# Patient Record
Sex: Female | Born: 1955 | Race: White | Hispanic: No | Marital: Married | Smoking: Never smoker
Health system: Southern US, Community
[De-identification: ages and names within clinical notes are randomized; demographics above are authoritative.]

## PROBLEM LIST (undated history)

## (undated) DIAGNOSIS — J189 Pneumonia, unspecified organism: Secondary | ICD-10-CM

## (undated) DIAGNOSIS — S92902A Unspecified fracture of left foot, initial encounter for closed fracture: Secondary | ICD-10-CM

## (undated) DIAGNOSIS — E785 Hyperlipidemia, unspecified: Secondary | ICD-10-CM

## (undated) DIAGNOSIS — S329XXA Fracture of unspecified parts of lumbosacral spine and pelvis, initial encounter for closed fracture: Secondary | ICD-10-CM

## (undated) DIAGNOSIS — I447 Left bundle-branch block, unspecified: Secondary | ICD-10-CM

## (undated) DIAGNOSIS — I10 Essential (primary) hypertension: Secondary | ICD-10-CM

## (undated) DIAGNOSIS — R011 Cardiac murmur, unspecified: Secondary | ICD-10-CM

## (undated) HISTORY — DX: Fracture of unspecified parts of lumbosacral spine and pelvis, initial encounter for closed fracture: S32.9XXA

## (undated) HISTORY — PX: ROTATOR CUFF REPAIR: SHX139

## (undated) HISTORY — DX: Essential (primary) hypertension: I10

## (undated) HISTORY — DX: Cardiac murmur, unspecified: R01.1

## (undated) HISTORY — DX: Left bundle-branch block, unspecified: I44.7

## (undated) HISTORY — PX: PIP JOINT FUSION: SHX2238

## (undated) HISTORY — DX: Unspecified fracture of left foot, initial encounter for closed fracture: S92.902A

## (undated) HISTORY — DX: Hyperlipidemia, unspecified: E78.5

---

## 2000-01-13 ENCOUNTER — Ambulatory Visit (HOSPITAL_COMMUNITY): Admission: RE | Admit: 2000-01-13 | Discharge: 2000-01-13 | Payer: Self-pay | Admitting: Gastroenterology

## 2005-06-14 ENCOUNTER — Ambulatory Visit: Payer: Self-pay | Admitting: Internal Medicine

## 2005-07-02 ENCOUNTER — Ambulatory Visit: Payer: Self-pay | Admitting: Internal Medicine

## 2005-07-02 HISTORY — PX: COLONOSCOPY: SHX174

## 2006-08-02 LAB — HM COLONOSCOPY

## 2011-12-21 ENCOUNTER — Other Ambulatory Visit (HOSPITAL_BASED_OUTPATIENT_CLINIC_OR_DEPARTMENT_OTHER): Payer: Self-pay | Admitting: Family Medicine

## 2011-12-21 ENCOUNTER — Ambulatory Visit (HOSPITAL_BASED_OUTPATIENT_CLINIC_OR_DEPARTMENT_OTHER)
Admission: RE | Admit: 2011-12-21 | Discharge: 2011-12-21 | Disposition: A | Discharge status: Home / Self Care | Payer: BC Managed Care – PPO | Source: Ambulatory Visit | Attending: Family Medicine | Admitting: Family Medicine

## 2011-12-21 DIAGNOSIS — R05 Cough: Secondary | ICD-10-CM

## 2011-12-21 DIAGNOSIS — R059 Cough, unspecified: Secondary | ICD-10-CM | POA: Insufficient documentation

## 2012-03-16 LAB — HM MAMMOGRAPHY

## 2012-07-28 ENCOUNTER — Encounter: Payer: Self-pay | Admitting: Internal Medicine

## 2012-09-07 LAB — HM PAP SMEAR

## 2012-11-09 ENCOUNTER — Ambulatory Visit (INDEPENDENT_AMBULATORY_CARE_PROVIDER_SITE_OTHER): Payer: BC Managed Care – PPO | Admitting: Family Medicine

## 2012-11-09 ENCOUNTER — Encounter: Payer: Self-pay | Admitting: Family Medicine

## 2012-11-09 VITALS — BP 162/89 | HR 89 | Temp 97.5°F | Ht 64.0 in | Wt 151.0 lb

## 2012-11-09 DIAGNOSIS — R109 Unspecified abdominal pain: Secondary | ICD-10-CM

## 2012-11-09 DIAGNOSIS — R112 Nausea with vomiting, unspecified: Secondary | ICD-10-CM

## 2012-11-09 LAB — POCT URINALYSIS DIPSTICK
Bilirubin, UA: NEGATIVE
Blood, UA: NEGATIVE
Clarity, UA: NEGATIVE
Color, UA: NEGATIVE
Glucose, UA: NEGATIVE
Ketones, UA: POSITIVE
Leukocytes, UA: NEGATIVE
Nitrite, UA: NEGATIVE
Protein, UA: NEGATIVE
Spec Grav, UA: 1.01
Urobilinogen, UA: NEGATIVE
pH, UA: 6

## 2012-11-09 MED ORDER — ONDANSETRON HCL 4 MG PO TABS: 4.0000 mg | ORAL_TABLET | Freq: Three times a day (TID) | ORAL | Status: DC | PRN

## 2012-11-09 MED ORDER — DIPHENOXYLATE-ATROPINE 2.5-0.025 MG PO TABS: 1.0000 | ORAL_TABLET | Freq: Four times a day (QID) | ORAL | Status: DC | PRN

## 2012-11-09 MED ORDER — MECLIZINE HCL 25 MG PO TABS: 25.0000 mg | ORAL_TABLET | Freq: Three times a day (TID) | ORAL | Status: DC | PRN

## 2012-11-09 MED ORDER — PROMETHAZINE HCL 25 MG/ML IJ SOLN
25.0000 mg | Freq: Once | INTRAMUSCULAR | Status: AC
  Administered 2012-11-09: 25 mg via INTRAMUSCULAR

## 2012-11-09 NOTE — Patient Instructions (Addendum)
1)  Nausea - Zofran (1-2 up to 3 times per day)   2)  Diarrhea - Lomotil (Take as directed on bottle)   3)  Fluid - G2 or water or Crystal Light   4)  BRAT Diet / Chicken & Rice Soup  5)  Probiotic pills (Align), Activia, Lemon Ginger Tea   6)  If you continue to have dizziness you can take the Antivert.     Diet for Diarrhea, Adult Having frequent, runny stools (diarrhea) has many causes. Diarrhea may be caused or worsened by food or drink. Diarrhea may be relieved by changing your diet. IF YOU ARE NOT TOLERATING SOLID FOODS:  Drink enough water and fluids to keep your urine clear or pale yellow.  Avoid sugary drinks and sodas as well as milk-based beverages.  Avoid beverages containing caffeine and alcohol.  You may try rehydrating beverages. You can make your own by following this recipe:   tsp table salt.   tsp baking soda.   tsp salt substitute (potassium chloride).  1 tbs + 1 tsp sugar.  1 qt water. As your stools become more solid, you can start eating solid foods. Add foods one at a time. If a certain food causes your diarrhea to get worse, avoid that food and try other foods. A low fiber, low-fat, and lactose-free diet is recommended. Small, frequent meals may be better tolerated.  Starches  Allowed:  White, [place], and pita breads, plain rolls, buns, bagels. Plain muffins, matzo. Soda, saltine, or graham crackers. Pretzels, melba toast, zwieback. Cooked cereals made with water: cornmeal, farina, cream cereals. Dry cereals: refined corn, wheat, rice. Potatoes prepared any way without skins, refined macaroni, spaghetti, noodles, refined rice.  Avoid:  Bread, rolls, or crackers made with whole wheat, multi-grains, rye, bran seeds, nuts, or coconut. Corn tortillas or taco shells. Cereals containing whole grains, multi-grains, bran, coconut, nuts, or raisins. Cooked or dry oatmeal. Coarse wheat cereals, granola. Cereals advertised as "high-fiber." Potato skins. Whole  grain pasta, wild or brown rice. Popcorn. Sweet potatoes/yams. Sweet rolls, doughnuts, waffles, pancakes, sweet breads. Vegetables  Allowed: Strained tomato and vegetable juices. Most well-cooked and canned vegetables without seeds. Fresh: Tender lettuce, cucumber without the skin, cabbage, spinach, bean sprouts.  Avoid: Fresh, cooked, or canned: Artichokes, baked beans, beet greens, broccoli, Brussels sprouts, corn, kale, legumes, peas, sweet potatoes. Cooked: Green or red cabbage, spinach. Avoid large servings of any vegetables, because vegetables shrink when cooked, and they contain more fiber per serving than fresh vegetables. Fruit  Allowed: All fruit juices except prune juice. Cooked or canned: Apricots, applesauce, cantaloupe, cherries, fruit cocktail, grapefruit, grapes, kiwi, mandarin oranges, peaches, pears, plums, watermelon. Fresh: Apples without skin, ripe banana, grapes, cantaloupe, cherries, grapefruit, peaches, oranges, plums. Keep servings limited to  cup or 1 piece.  Avoid: Fresh: Apple with skin, apricots, mango, pears, raspberries, strawberries. Prune juice, stewed or dried prunes. Dried fruits, raisins, dates. Large servings of all fresh fruits. Meat and Meat Substitutes  Allowed: Ground or well-cooked tender beef, ham, veal, lamb, pork, or poultry. Eggs, plain cheese. Fish, oysters, shrimp, lobster, other seafoods. Liver, organ meats.  Avoid: Tough, fibrous meats with gristle. Peanut butter, smooth or chunky. Cheese, nuts, seeds, legumes, dried peas, beans, lentils. Milk  Allowed: Yogurt, lactose-free milk, kefir, drinkable yogurt, buttermilk, soy milk.  Avoid: Milk, chocolate milk, beverages made with milk, such as milk shakes. Soups  Allowed: Bouillon, broth, or soups made from allowed foods. Any strained soup.  Avoid: Soups made from vegetables  that are not allowed, cream or milk-based soups. Desserts and Sweets  Allowed: Sugar-free gelatin, sugar-free frozen ice  pops made without sugar alcohol.  Avoid: Plain cakes and cookies, pie made with allowed fruit, pudding, custard, cream pie. Gelatin, fruit, ice, sherbet, frozen ice pops. Ice cream, ice milk without nuts. Plain hard candy, honey, jelly, molasses, syrup, sugar, chocolate syrup, gumdrops, marshmallows. Fats and Oils  Allowed: Avoid any fats and oils.  Avoid: Seeds, nuts, olives, avocados. Margarine, butter, cream, mayonnaise, salad oils, plain salad dressings made from allowed foods. Plain gravy, crisp bacon without rind. Beverages  Allowed: Water, decaffeinated teas, oral rehydration solutions, sugar-free beverages.  Avoid: Fruit juices, caffeinated beverages (coffee, tea, soda or pop), alcohol, sports drinks, or lemon-lime soda or pop. Condiments  Allowed: Ketchup, mustard, horseradish, vinegar, cream sauce, cheese sauce, cocoa powder. Spices in moderation: allspice, basil, bay leaves, celery powder or leaves, cinnamon, cumin powder, curry powder, ginger, mace, marjoram, onion or garlic powder, oregano, paprika, parsley flakes, ground pepper, rosemary, sage, savory, tarragon, thyme, turmeric.  Avoid: Coconut, honey. Weight Monitoring: Weigh yourself every day. You should weigh yourself in the morning after you urinate and before you eat breakfast. Wear the same amount of clothing when you weigh yourself. Record your weight daily. Bring your recorded weights to your clinic visits. Tell your caregiver right away if you have gained 3 lb/1.4 kg or more in 1 day, 5 lb/2.3 kg in a week, or whatever amount you were told to report. SEEK IMMEDIATE MEDICAL CARE IF:   You are unable to keep fluids down.  You start to throw up (vomit) or diarrhea keeps coming back (persistent).  Abdominal pain develops, increases, or can be felt in one place (localizes).  You have an oral temperature above 102 F (38.9 C), not controlled by medicine.  Diarrhea contains blood or mucus.  You develop excessive  weakness, dizziness, fainting, or extreme thirst. MAKE SURE YOU:   Understand these instructions.  Will watch your condition.  Will get help right away if you are not doing well or get worse. Document Released: 10/09/2003 Document Revised: 10/11/2011 Document Reviewed: 12/03/2011 [facility] Patient Information 2013 [place], [place]. Norovirus Infection Norovirus illness is caused by a viral infection. The term norovirus refers to a group of viruses. Any of those viruses can cause norovirus illness. This illness is often referred to by other names such as viral gastroenteritis, stomach flu, and food poisoning. Anyone can get a norovirus infection. People can have the illness multiple times during their lifetime. CAUSES  Norovirus is found in the stool or vomit of infected people. It is easily spread from person to person (contagious). People with norovirus are contagious from the moment they begin feeling ill. They may remain contagious for as long as 3 days to 2 weeks after recovery. People can become infected with the virus in several ways. This includes:  Eating food or drinking liquids that are contaminated with norovirus.  Touching surfaces or objects contaminated with norovirus, and then placing your hand in your mouth.  Having direct contact with a person who is infected and shows symptoms. This may occur while caring for someone with illness or while sharing foods or eating utensils with someone who is ill. SYMPTOMS  Symptoms usually begin 1 to 2 days after ingestion of the virus. Symptoms may include:  Nausea.  Vomiting.  Diarrhea.  Stomach cramps.  Low-grade fever.  Chills.  Headache.  Muscle aches.  Tiredness. Most people with norovirus illness get better within 1  to 2 days. Some people become dehydrated because they cannot drink enough liquids to replace those lost from vomiting and diarrhea. This is especially true for young children, the elderly, and others who are  unable to care for themselves. DIAGNOSIS  Diagnosis is based on your symptoms and exam. Currently, only state public health laboratories have the ability to test for norovirus in stool or vomit. TREATMENT  No specific treatment exists for norovirus infections. No vaccine is available to prevent infections. Norovirus illness is usually brief in healthy people. If you are ill with vomiting and diarrhea, you should drink enough water and fluids to keep your urine clear or pale yellow. Dehydration is the most serious health effect that can result from this infection. By drinking oral rehydration solution (ORS), people can reduce their chance of becoming dehydrated. There are many commercially available pre-made and powdered ORS designed to safely rehydrate people. These may be recommended by your caregiver. Replace any new fluid losses from diarrhea or vomiting with ORS as follows:  If your child weighs 10 kg or less (22 lb or less), give 60 to 120 ml ( to  cup or 2 to 4 oz) of ORS for each diarrheal stool or vomiting episode.  If your child weighs more than 10 kg (more than 22 lb), give 120 to 240 ml ( to 1 cup or 4 to 8 oz) of ORS for each diarrheal stool or vomiting episode. HOME CARE INSTRUCTIONS   Follow all your caregiver's instructions.  Avoid sugar-free and alcoholic drinks while ill.  Only take over-the-counter or prescription medicines for pain, vomiting, diarrhea, or fever as directed by your caregiver. You can decrease your chances of coming in contact with norovirus or spreading it by following these steps:  Frequently wash your hands, especially after using the toilet, changing diapers, and before eating or preparing food.  Carefully wash fruits and vegetables. Cook shellfish before eating them.  Do not prepare food for others while you are infected and for at least 3 days after recovering from illness.  Thoroughly clean and disinfect contaminated surfaces immediately after an  episode of illness using a bleach-based household cleaner.  Immediately remove and wash clothing or linens that may be contaminated with the virus.  Use the toilet to dispose of any vomit or stool. Make sure the surrounding area is kept clean.  Food that may have been contaminated by an ill person should be discarded. SEEK IMMEDIATE MEDICAL CARE IF:   You develop symptoms of dehydration that do not improve with fluid replacement. This may include:  Excessive sleepiness.  Lack of tears.  Dry mouth.  Dizziness when standing.  Weak pulse. Document Released: 10/09/2002 Document Revised: 10/11/2011 Document Reviewed: 11/10/2009 [facility] Patient Information 2013 [place], [place].

## 2012-11-09 NOTE — Progress Notes (Signed)
Subjective:     Patient ID: Brenda Ayala, female   DOB: [birth year], 57 y.o.   MRN: [id number]  HPI Brenda Ayala  is here today with her husband Brenda Ayala with what appears to be a viral gastroenteritis.  She awoke this morning feeling dizzy then she began having vomiting and diarrhea. She feels that her symptoms are severe in nature and she is worsening.  She has not taken any medications for these symptoms.   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  She also mentions that she had a glycolic acid procedure done at her dermatologist's office yesterday but she doesn't think these are related.    Review of Systems  Gastrointestinal: Positive for nausea and diarrhea.  Neurological: Positive for dizziness.       Objective:   Physical Exam  Vitals reviewed. Constitutional:  Appears ill with the smell of ketones  Cardiovascular: Normal rate and regular rhythm.   Pulmonary/Chest: Effort normal and breath sounds normal.  Abdominal: Soft. She exhibits no distension and no mass. There is tenderness. There is no rebound and no guarding.       Assessment:     Viral Gastroenteritis (Vomiting/Diarrhea)     Plan:    Checked a U/A since she had such a strong smell of ketones.  Ketones were high but her sugar was normal and her urine was not infected.  She was given a shot of Phenergan in the office.  She was also given prescriptions for Zofran and Lomotil for her GI symptoms and Antivert for her dizziness.

## 2012-11-11 ENCOUNTER — Encounter: Payer: Self-pay | Admitting: Family Medicine

## 2012-11-11 DIAGNOSIS — R109 Unspecified abdominal pain: Secondary | ICD-10-CM | POA: Insufficient documentation

## 2012-11-11 DIAGNOSIS — R112 Nausea with vomiting, unspecified: Secondary | ICD-10-CM | POA: Insufficient documentation

## 2012-11-14 ENCOUNTER — Ambulatory Visit (INDEPENDENT_AMBULATORY_CARE_PROVIDER_SITE_OTHER): Payer: BC Managed Care – PPO | Admitting: Family Medicine

## 2012-11-14 VITALS — BP 109/64 | HR 59 | Wt 150.0 lb

## 2012-11-14 DIAGNOSIS — R824 Acetonuria: Secondary | ICD-10-CM

## 2012-11-14 DIAGNOSIS — R42 Dizziness and giddiness: Secondary | ICD-10-CM

## 2012-11-14 LAB — POCT URINALYSIS DIPSTICK
Bilirubin, UA: NEGATIVE
Blood, UA: NEGATIVE
Glucose, UA: NEGATIVE
Ketones, UA: NEGATIVE
Leukocytes, UA: NEGATIVE
Nitrite, UA: NEGATIVE
Protein, UA: NEGATIVE
Spec Grav, UA: 1.01
Urobilinogen, UA: NEGATIVE
pH, UA: 5

## 2012-11-14 MED ORDER — SCOPOLAMINE 1 MG/3DAYS TD PT72: 1.0000 | MEDICATED_PATCH | TRANSDERMAL | Status: AC

## 2012-11-14 NOTE — Progress Notes (Signed)
Subjective:     Patient ID: Brenda Ayala, female   DOB: [birth year], 57 y.o.   MRN: [id number]  HPI Brenda Ayala is here today with her husband complaining of dizziness.  This started the day she developed GI symptoms last week. Her vomiting and diarrhea have resolved but she continues to be dizzy.  She has taken the Antivert she was given at her last visit.  It makes her very sleepy.    Review of Systems  Constitutional: Positive for fatigue.  Cardiovascular: Negative for chest pain.  Gastrointestinal: Positive for nausea. Negative for vomiting and diarrhea.  Neurological: Positive for dizziness.      Past Medical History  Diagnosis Date  . Heart murmur   . Fracture of left foot   . Fracture of left pelvis   . Hyperlipidemia   . 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  Her EF was mildly reduced to 50-55%  . Hypertension    Family History  Problem Relation Age of Onset  . Heart disease Mother    History   Social History  . Marital Status: Married    Spouse Name: N/A    Number of Children: N/A  . Years of Education: N/A   Occupational History  . Not on file.   Social History Main Topics  . Smoking status: Never Smoker   . Smokeless tobacco: Not on file  . Alcohol Use: No  . Drug Use: No  . Sexually Active: Yes    Birth Control/ Protection: Post-menopausal   Other Topics Concern  . 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  Appears tired   HENT:  Head: Normocephalic.  Eyes: Conjunctivae are normal. No scleral icterus.  Neck: Normal range of motion. Neck supple. No thyromegaly present.  Cardiovascular: Normal rate, regular rhythm and normal heart sounds.   Pulmonary/Chest: Effort normal and breath sounds normal.  Abdominal: Soft. Bowel sounds are normal. She exhibits no distension and no mass. There is no tenderness. There is no rebound and no guarding.  Lymphadenopathy:    She has no cervical adenopathy.  Neurological: She is alert.  Skin: No rash noted.  Psychiatric: She has a normal mood and affect.       Assessment:     Dizziness     Plan:     She was given a prescription for Scopalamine patches.   She was shown how to do the Hall-Pike maneuver.

## 2012-11-14 NOTE — Patient Instructions (Addendum)
1)  Dizziness - Try the patch vs Antivert; Hall-Pike Maneuvers     Dizziness Dizziness is a common problem. It is a feeling of unsteadiness or lightheadedness. You may feel like you are about to faint. Dizziness can lead to injury if you stumble or fall. A person of any age group can suffer from dizziness, but dizziness is more common in older adults. CAUSES  Dizziness can be caused by many different things, including:  Middle ear problems.  Standing for too long.  Infections.  An allergic reaction.  Aging.  An emotional response to something, such as the sight of blood.  Side effects of medicines.  Fatigue.  Problems with circulation or blood pressure.  Excess use of alcohol, medicines, or illegal drug use.  Breathing too fast (hyperventilation).  An arrhythmia or problems with your heart rhythm.  Low red blood cell count (anemia).  Pregnancy.  Vomiting, diarrhea, fever, or other illnesses that cause dehydration.  Diseases or conditions such as Parkinson's disease, high blood pressure (hypertension), diabetes, and thyroid problems.  Exposure to extreme heat. DIAGNOSIS  To find the cause of your dizziness, your caregiver may do a physical exam, lab tests, radiologic imaging scans, or an electrocardiography test (ECG).  TREATMENT  Treatment of dizziness depends on the cause of your symptoms and can vary greatly. HOME CARE INSTRUCTIONS   Drink enough fluids to keep your urine clear or pale yellow. This is especially important in very hot weather. In the elderly, it is also important in cold weather.  If your dizziness is caused by medicines, take them exactly as directed. When taking blood pressure medicines, it is especially important to get up slowly.  Rise slowly from chairs and steady yourself until you feel okay.  In the morning, first sit up on the side of the bed. When this seems okay, stand slowly while holding onto something until you know your balance is  fine.  If you need to stand in one place for a long time, be sure to move your legs often. Tighten and relax the muscles in your legs while standing.  If dizziness continues to be a problem, have someone stay with you for a day or two. Do this until you feel you are well enough to stay alone. Have the person call your caregiver if he or she notices changes in you that are concerning.  Do not drive or use heavy machinery if you feel dizzy.  Do not drink alcohol. SEEK IMMEDIATE MEDICAL CARE IF:   Your dizziness or lightheadedness gets worse.  You feel nauseous or vomit.  You develop problems with talking, walking, weakness, or using your arms, hands, or legs.  You are not thinking clearly or you have difficulty forming sentences. It may take a friend or family member to determine if your thinking is normal.  You develop chest pain, abdominal pain, shortness of breath, or sweating.  Your vision changes.  You notice any bleeding.  You have side effects from medicine that seems to be getting worse rather than better. MAKE SURE YOU:   Understand these instructions.  Will watch your condition.  Will get help right away if you are not doing well or get worse. Document Released: 01/12/2001 Document Revised: 10/11/2011 Document Reviewed: 02/05/2011 [facility] Patient Information 2013 [place], [place]. Vertigo Vertigo means you feel like you or your surroundings are moving when they are not. Vertigo can be dangerous if it occurs when you are at work, driving, or performing difficult activities.  CAUSES  Vertigo occurs when there is a conflict of signals sent to your brain from the visual and sensory systems in your body. There are many different causes of vertigo, including:  Infections, especially in the inner ear.  A bad reaction to a drug or misuse of alcohol and medicines.  Withdrawal from drugs or alcohol.  Rapidly changing positions, such as lying down or rolling over in  bed.  A migraine headache.  Decreased blood flow to the brain.  Increased pressure in the brain from a head injury, infection, tumor, or bleeding. SYMPTOMS  You may feel as though the world is spinning around or you are falling to the ground. Because your balance is upset, vertigo can cause nausea and vomiting. You may have involuntary eye movements (nystagmus). DIAGNOSIS  Vertigo is usually diagnosed by physical exam. If the cause of your vertigo is unknown, your caregiver may perform imaging tests, such as an MRI scan (magnetic resonance imaging). TREATMENT  Most cases of vertigo resolve on their own, without treatment. Depending on the cause, your caregiver may prescribe certain medicines. If your vertigo is related to body position issues, your caregiver may recommend movements or procedures to correct the problem. In rare cases, if your vertigo is caused by certain inner ear problems, you may need surgery. HOME CARE INSTRUCTIONS   Follow your caregiver's instructions.  Avoid driving.  Avoid operating heavy machinery.  Avoid performing any tasks that would be dangerous to you or others during a vertigo episode.  Tell your caregiver if you notice that certain medicines seem to be causing your vertigo. Some of the medicines used to treat vertigo episodes can actually make them worse in some people. SEEK IMMEDIATE MEDICAL CARE IF:   Your medicines do not relieve your vertigo or are making it worse.  You develop problems with talking, walking, weakness, or using your arms, hands, or legs.  You develop severe headaches.  Your nausea or vomiting continues or gets worse.  You develop visual changes.  A family member notices behavioral changes.  Your condition gets worse. MAKE SURE YOU:  Understand these instructions.  Will watch your condition.  Will get help right away if you are not doing well or get worse. Document Released: 04/28/2005 Document Revised: 10/11/2011  Document Reviewed: 02/04/2011 [facility] Patient Information 2013 [place], [place]. Vertigo Vertigo means you feel like you or your surroundings are moving when they are not. Vertigo can be dangerous if it occurs when you are at work, driving, or performing difficult activities.  CAUSES  Vertigo occurs when there is a conflict of signals sent to your brain from the visual and sensory systems in your body. There are many different causes of vertigo, including:  Infections, especially in the inner ear.  A bad reaction to a drug or misuse of alcohol and medicines.  Withdrawal from drugs or alcohol.  Rapidly changing positions, such as lying down or rolling over in bed.  A migraine headache.  Decreased blood flow to the brain.  Increased pressure in the brain from a head injury, infection, tumor, or bleeding. SYMPTOMS  You may feel as though the world is spinning around or you are falling to the ground. Because your balance is upset, vertigo can cause nausea and vomiting. You may have involuntary eye movements (nystagmus). DIAGNOSIS  Vertigo is usually diagnosed by physical exam. If the cause of your vertigo is unknown, your caregiver may perform imaging tests, such as an MRI scan (magnetic resonance imaging). TREATMENT  Most cases of  vertigo resolve on their own, without treatment. Depending on the cause, your caregiver may prescribe certain medicines. If your vertigo is related to body position issues, your caregiver may recommend movements or procedures to correct the problem. In rare cases, if your vertigo is caused by certain inner ear problems, you may need surgery. HOME CARE INSTRUCTIONS   Follow your caregiver's instructions.  Avoid driving.  Avoid operating heavy machinery.  Avoid performing any tasks that would be dangerous to you or others during a vertigo episode.  Tell your caregiver if you notice that certain medicines seem to be causing your vertigo. Some of the medicines  used to treat vertigo episodes can actually make them worse in some people. SEEK IMMEDIATE MEDICAL CARE IF:   Your medicines do not relieve your vertigo or are making it worse.  You develop problems with talking, walking, weakness, or using your arms, hands, or legs.  You develop severe headaches.  Your nausea or vomiting continues or gets worse.  You develop visual changes.  A family member notices behavioral changes.  Your condition gets worse. MAKE SURE YOU:  Understand these instructions.  Will watch your condition.  Will get help right away if you are not doing well or get worse. Document Released: 04/28/2005 Document Revised: 10/11/2011 Document Reviewed: 02/04/2011 [facility] Patient Information 2013 [place], [place].

## 2012-11-18 ENCOUNTER — Encounter: Payer: Self-pay | Admitting: Family Medicine

## 2012-11-18 DIAGNOSIS — R824 Acetonuria: Secondary | ICD-10-CM | POA: Insufficient documentation

## 2012-11-18 DIAGNOSIS — R42 Dizziness and giddiness: Secondary | ICD-10-CM | POA: Insufficient documentation

## 2012-11-22 ENCOUNTER — Telehealth: Payer: Self-pay | Admitting: *Deleted

## 2012-11-22 NOTE — Telephone Encounter (Signed)
PT CALLED LM ON VOICE MAIL  REQUEST A CALL BACK FROM DR. ZANARDS NURSE. PLEASE CALL HER BACK AT [phone]

## 2012-11-27 NOTE — Telephone Encounter (Signed)
Several attempts to call. No Answer. PG

## 2013-01-26 ENCOUNTER — Telehealth: Payer: Self-pay | Admitting: Family Medicine

## 2013-01-26 MED ORDER — AMLODIPINE BESYLATE 2.5 MG PO TABS: 2.5000 mg | ORAL_TABLET | Freq: Every day | ORAL | Status: DC

## 2013-01-26 NOTE — Telephone Encounter (Signed)
Done

## 2013-02-05 ENCOUNTER — Other Ambulatory Visit: Payer: Self-pay | Admitting: *Deleted

## 2013-02-05 ENCOUNTER — Ambulatory Visit: Payer: BC Managed Care – PPO | Admitting: Family Medicine

## 2013-02-05 DIAGNOSIS — E785 Hyperlipidemia, unspecified: Secondary | ICD-10-CM

## 2013-02-05 DIAGNOSIS — I10 Essential (primary) hypertension: Secondary | ICD-10-CM

## 2013-02-06 ENCOUNTER — Other Ambulatory Visit: Payer: BC Managed Care – PPO

## 2013-02-06 LAB — COMPLETE METABOLIC PANEL WITH GFR
ALT: 17 U/L (ref 0–35)
AST: 23 U/L (ref 0–37)
Albumin: 4.7 g/dL (ref 3.5–5.2)
Alkaline Phosphatase: 65 U/L (ref 39–117)
BUN: 19 mg/dL (ref 6–23)
CO2: 27 mEq/L (ref 19–32)
Calcium: 9.6 mg/dL (ref 8.4–10.5)
Chloride: 103 mEq/L (ref 96–112)
Creat: 0.81 mg/dL (ref 0.50–1.10)
GFR, Est African American: >89 mL/min
GFR, Est Non African American: 80 mL/min
Glucose, Bld: 96 mg/dL (ref 70–99)
Potassium: 4.6 mEq/L (ref 3.5–5.3)
Sodium: 139 mEq/L (ref 135–145)
Total Bilirubin: 0.5 mg/dL (ref 0.3–1.2)
Total Protein: 7 g/dL (ref 6.0–8.3)

## 2013-02-06 LAB — MICROALBUMIN, URINE: Microalb, Ur: 0.5 mg/dL (ref 0.00–1.89)

## 2013-02-06 LAB — LIPID PANEL
Cholesterol: 249 mg/dL — ABNORMAL HIGH (ref 0–200)
HDL: 53 mg/dL (ref >39)
LDL Cholesterol: 176 mg/dL — ABNORMAL HIGH (ref 0–99)
Total CHOL/HDL Ratio: 4.7 Ratio
Triglycerides: 101 mg/dL (ref <150)
VLDL: 20 mg/dL (ref 0–40)

## 2013-02-08 IMAGING — CR DG CHEST 2V
2 series · 2 of 2 positions shown · non-contrast
Comparison: None.

CLINICAL DATA: Cough times 1.5 weeks.

CHEST - 2 VIEW

[w chest pa]
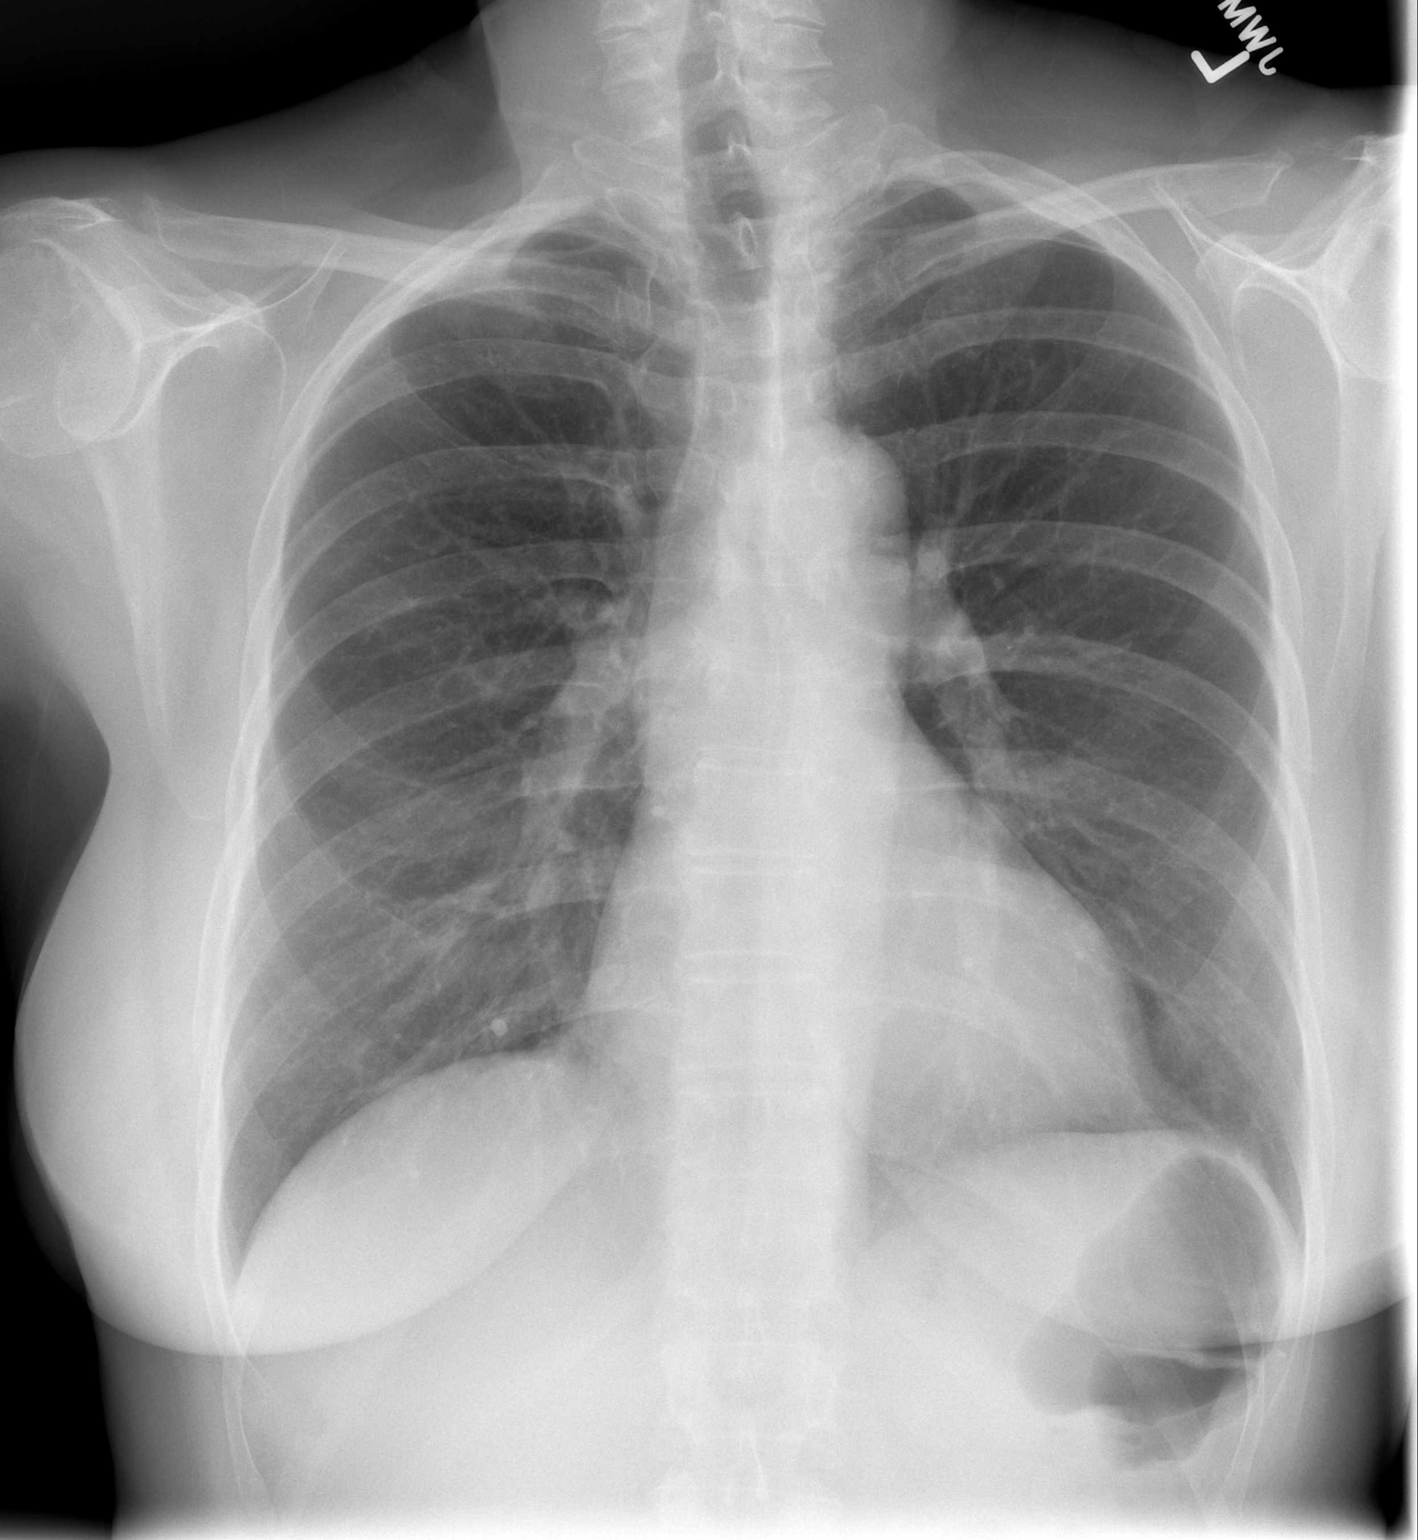

[w chest lat]
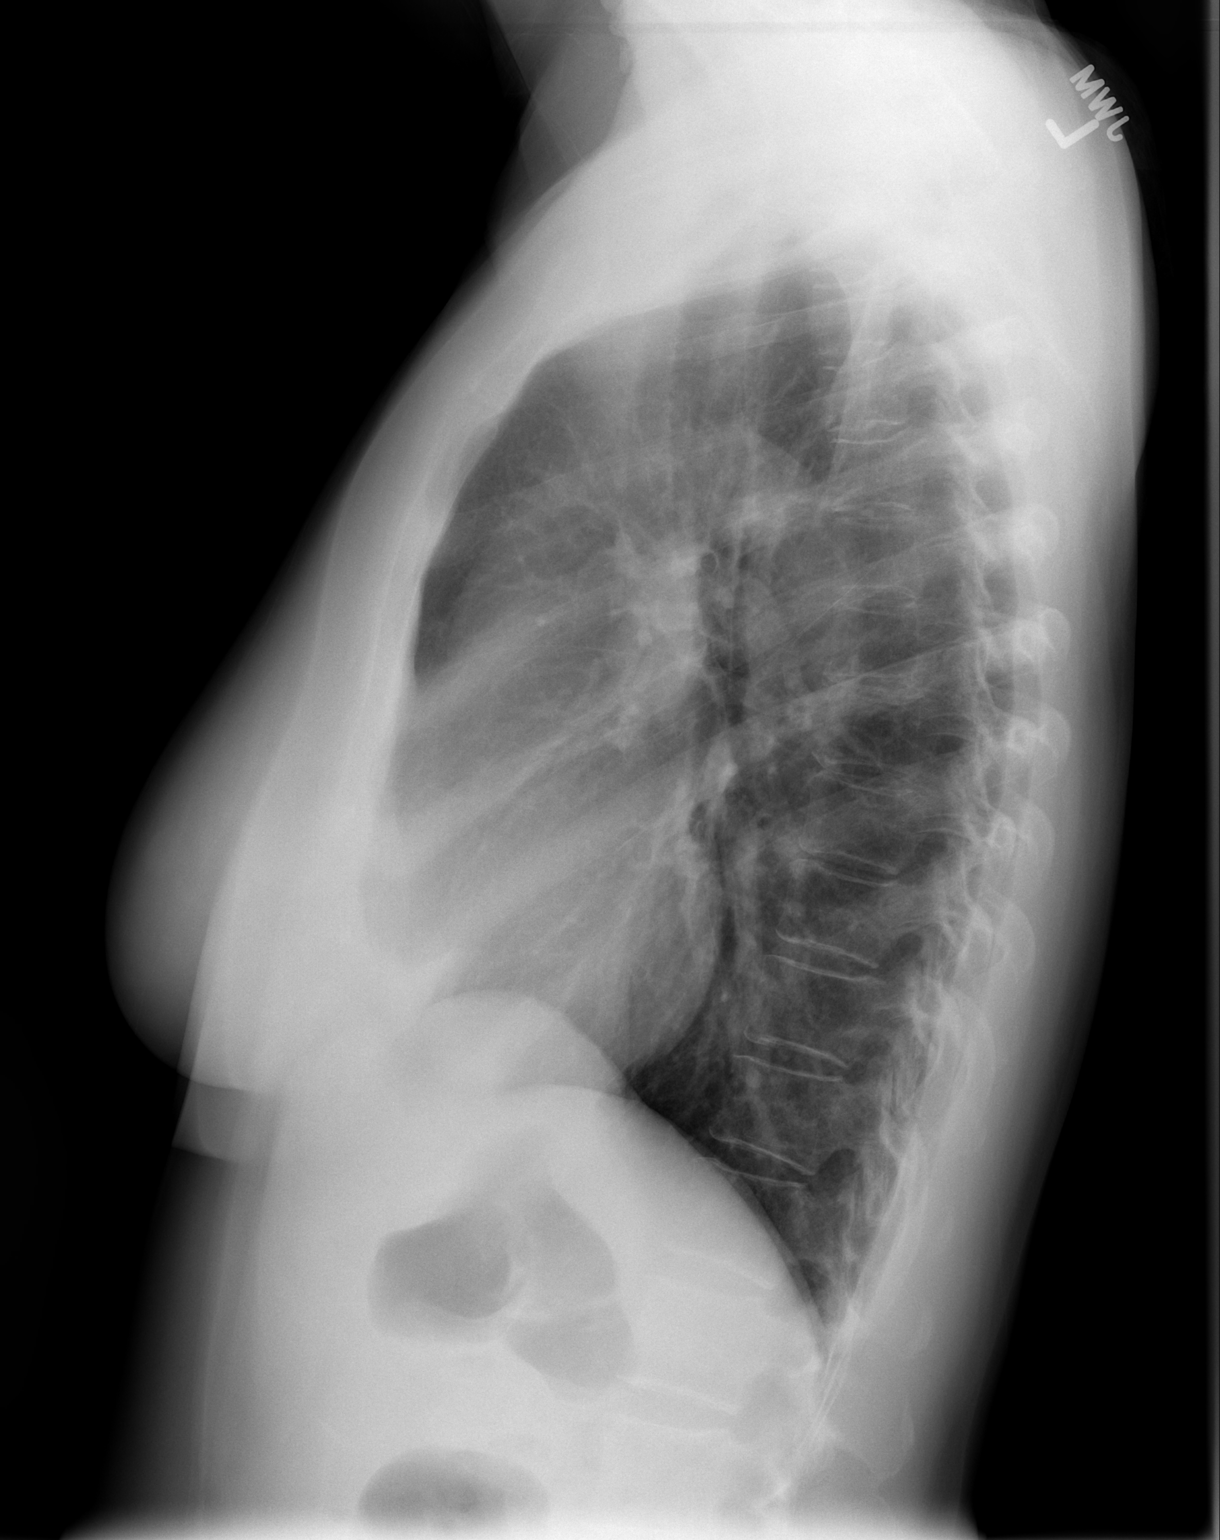

[2 of 2 positions shown; findings below may reference images not displayed]

FINDINGS: The heart size and mediastinal contours are normal.  The
lungs are clear.  There is no pleural effusion or pneumothorax.
The patient appears to be status post distal clavicle resection
bilaterally.  No acute osseous findings are seen.
IMPRESSION: No active cardiopulmonary process.

## 2013-02-15 ENCOUNTER — Ambulatory Visit: Payer: BC Managed Care – PPO | Admitting: Family Medicine

## 2013-02-22 ENCOUNTER — Ambulatory Visit: Payer: BC Managed Care – PPO | Admitting: Family Medicine

## 2013-03-02 ENCOUNTER — Other Ambulatory Visit: Payer: Self-pay | Admitting: Family Medicine

## 2013-03-12 ENCOUNTER — Encounter: Payer: Self-pay | Admitting: Family Medicine

## 2013-03-12 ENCOUNTER — Ambulatory Visit (INDEPENDENT_AMBULATORY_CARE_PROVIDER_SITE_OTHER): Payer: BC Managed Care – PPO | Admitting: Family Medicine

## 2013-03-12 VITALS — BP 138/83 | HR 58 | Resp 16 | Ht 64.0 in | Wt 154.0 lb

## 2013-03-12 DIAGNOSIS — E785 Hyperlipidemia, unspecified: Secondary | ICD-10-CM

## 2013-03-12 DIAGNOSIS — I10 Essential (primary) hypertension: Secondary | ICD-10-CM

## 2013-03-12 DIAGNOSIS — R42 Dizziness and giddiness: Secondary | ICD-10-CM

## 2013-03-12 MED ORDER — TRIAMTERENE-HCTZ 37.5-25 MG PO TABS: ORAL_TABLET | ORAL | Status: DC

## 2013-03-12 MED ORDER — AMLODIPINE BESYLATE 5 MG PO TABS: ORAL_TABLET | ORAL | Status: DC

## 2013-03-12 MED ORDER — PRAVASTATIN SODIUM 40 MG PO TABS: 40.0000 mg | ORAL_TABLET | Freq: Every evening | ORAL | Status: DC

## 2013-03-12 NOTE — Patient Instructions (Addendum)
1)  BP - Stay on the amlodipine 1/2 of the 5 mg. The Maxzide will also help.    2)  Dizziness - Maxzide/Antivert/ Dr. Bryson Ha on [address] between [place] and [place].    3)  Pravastatin - Try 40 mg of at night; We'll recheck your level in 3 months.     Fat and Cholesterol Control Diet Cholesterol levels in your body are determined significantly by your diet. Cholesterol levels may also be related to heart disease. The following material helps to explain this relationship and discusses what you can do to help keep your heart healthy. Not all cholesterol is bad. Low-density lipoprotein (LDL) cholesterol is the "bad" cholesterol. It may cause fatty deposits to build up inside your arteries. High-density lipoprotein (HDL) cholesterol is "good." It helps to remove the "bad" LDL cholesterol from your blood. Cholesterol is a very important risk factor for heart disease. Other risk factors are high blood pressure, smoking, stress, heredity, and weight. The heart muscle gets its supply of blood through the coronary arteries. If your LDL cholesterol is high and your HDL cholesterol is low, you are at risk for having fatty deposits build up in your coronary arteries. This leaves less room through which blood can flow. Without sufficient blood and oxygen, the heart muscle cannot function properly and you may feel chest pains (angina pectoris). When a coronary artery closes up entirely, a part of the heart muscle may die causing a heart attack (myocardial infarction). CHECKING CHOLESTEROL When your caregiver sends your blood to a lab to be examined for cholesterol, a complete lipid (fat) profile may be done. With this test, the total amount of cholesterol and levels of LDL and HDL are determined. Triglycerides are a type of fat that circulates in the blood. They can also be used to determine heart disease risk. The list below describes what the numbers should be: Test: Total Cholesterol.  Less than 200  mg/dl. Test: LDL "bad cholesterol."  Less than 100 mg/dl.  Less than 70 mg/dl if you are at very high risk of a heart attack or sudden cardiac death. Test: HDL "good cholesterol."  Greater than 50 mg/dl for women.  Greater than 40 mg/dl for men. Test: Triglycerides.  Less than 150 mg/dl. CONTROLLING CHOLESTEROL WITH DIET Although exercise and lifestyle factors are important, your diet is key. That is because certain foods are known to raise cholesterol and others to lower it. The goal is to balance foods for their effect on cholesterol and more importantly, to replace saturated and trans fat with other types of fat, such as monounsaturated fat, polyunsaturated fat, and omega-3 fatty acids. On average, a person should consume no more than 15 to 17 g of saturated fat daily. Saturated and trans fats are considered "bad" fats, and they will raise LDL cholesterol. Saturated fats are primarily found in animal products such as meats, butter, and cream. However, that does not mean you need to give up all your favorite foods. Today, there are good tasting, low-fat, low-cholesterol substitutes for most of the things you like to eat. Choose low-fat or nonfat alternatives. Choose round or loin cuts of red meat. These types of cuts are lowest in fat and cholesterol. Chicken (without the skin), fish, veal, and ground [place] breast are great choices. Eliminate fatty meats, such as hot dogs and salami. Even shellfish have little or no saturated fat. Have a 3 oz (85 g) portion when you eat lean meat, poultry, or fish. Trans fats are also  called "partially hydrogenated oils." They are oils that have been scientifically manipulated so that they are solid at room temperature resulting in a longer shelf life and improved taste and texture of foods in which they are added. Trans fats are found in stick margarine, some tub margarines, cookies, crackers, and baked goods.  When baking and cooking, oils are a great  substitute for butter. The monounsaturated oils are especially beneficial since it is believed they lower LDL and raise HDL. The oils you should avoid entirely are saturated tropical oils, such as coconut and palm.  Remember to eat a lot from food groups that are naturally free of saturated and trans fat, including fish, fruit, vegetables, beans, grains (barley, rice, couscous, bulgur wheat), and pasta (without cream sauces).  IDENTIFYING FOODS THAT LOWER CHOLESTEROL  Soluble fiber may lower your cholesterol. This type of fiber is found in fruits such as apples, vegetables such as broccoli, potatoes, and carrots, legumes such as beans, peas, and lentils, and grains such as barley. Foods fortified with plant sterols (phytosterol) may also lower cholesterol. You should eat at least 2 g per day of these foods for a cholesterol lowering effect.  Read package labels to identify low-saturated fats, trans fat free, and low-fat foods at the supermarket. Select cheeses that have only 2 to 3 g saturated fat per ounce. Use a heart-healthy tub margarine that is free of trans fats or partially hydrogenated oil. When buying baked goods (cookies, crackers), avoid partially hydrogenated oils. Breads and muffins should be made from whole grains (whole-wheat or whole oat flour, instead of "flour" or "enriched flour"). Buy non-creamy canned soups with reduced salt and no added fats.  FOOD PREPARATION TECHNIQUES  Never deep-fry. If you must fry, either stir-fry, which uses very little fat, or use non-stick cooking sprays. When possible, broil, bake, or roast meats, and steam vegetables. Instead of putting butter or margarine on vegetables, use lemon and herbs, applesauce, and cinnamon (for squash and sweet potatoes), nonfat yogurt, salsa, and low-fat dressings for salads.  LOW-SATURATED FAT / LOW-FAT FOOD SUBSTITUTES Meats / Saturated Fat (g)  Avoid: Steak, marbled (3 oz/85 g) / 11 g  Choose: Steak, lean (3 oz/85 g) / 4  g  Avoid: Hamburger (3 oz/85 g) / 7 g  Choose: Hamburger, lean (3 oz/85 g) / 5 g  Avoid: Ham (3 oz/85 g) / 6 g  Choose: Ham, lean cut (3 oz/85 g) / 2.4 g  Avoid: Chicken, with skin, dark meat (3 oz/85 g) / 4 g  Choose: Chicken, skin removed, dark meat (3 oz/85 g) / 2 g  Avoid: Chicken, with skin, light meat (3 oz/85 g) / 2.5 g  Choose: Chicken, skin removed, light meat (3 oz/85 g) / 1 g Dairy / Saturated Fat (g)  Avoid: Whole milk (1 cup) / 5 g  Choose: Low-fat milk, 2% (1 cup) / 3 g  Choose: Low-fat milk, 1% (1 cup) / 1.5 g  Choose: Skim milk (1 cup) / 0.3 g  Avoid: Hard cheese (1 oz/28 g) / 6 g  Choose: Skim milk cheese (1 oz/28 g) / 2 to 3 g  Avoid: Cottage cheese, 4% fat (1 cup) / 6.5 g  Choose: Low-fat cottage cheese, 1% fat (1 cup) / 1.5 g  Avoid: Ice cream (1 cup) / 9 g  Choose: Sherbet (1 cup) / 2.5 g  Choose: Nonfat frozen yogurt (1 cup) / 0.3 g  Choose: Frozen fruit bar / trace  Avoid: Whipped cream (1  tbs) / 3.5 g  Choose: Nondairy whipped topping (1 tbs) / 1 g Condiments / Saturated Fat (g)  Avoid: Mayonnaise (1 tbs) / 2 g  Choose: Low-fat mayonnaise (1 tbs) / 1 g  Avoid: Butter (1 tbs) / 7 g  Choose: Extra light margarine (1 tbs) / 1 g  Avoid: Coconut oil (1 tbs) / 11.8 g  Choose: Olive oil (1 tbs) / 1.8 g  Choose: Corn oil (1 tbs) / 1.7 g  Choose: Safflower oil (1 tbs) / 1.2 g  Choose: Sunflower oil (1 tbs) / 1.4 g  Choose: Soybean oil (1 tbs) / 2.4 g  Choose: Canola oil (1 tbs) / 1 g Document Released: 07/19/2005 Document Revised: 10/11/2011 Document Reviewed: 01/07/2011 [employer] Patient Information 2014 [place], [place].

## 2013-03-12 NOTE — Progress Notes (Signed)
  Subjective:    Patient ID: Brenda Ayala, female    DOB: 07-Jan-1956, 57 y.o.   MRN: [id number]  HPI  Brenda Ayala is here today to go over her most recent lab results, get medication refills and to discuss the conditions listed below:     1)  Hypertension:  She is doing well on amlodipine and needs a refill on it.    2)  Weight:  She has been struggling to lose weight on her own.    3)  Vertigo:  She continues to struggle with this problem.  She has been doing the exercises she was shown and has been taking meclizine which has helped some.    4)  Hyperlipidemia:  She is not currently taking medication for her cholesterol.     Review of Systems  Constitutional: Negative.   HENT: Negative.   Eyes: Negative.   Respiratory: Negative.   Cardiovascular: Negative.   Gastrointestinal: Negative.   Endocrine: Negative.   Musculoskeletal: Negative.   Skin: Negative.   Allergic/Immunologic: Negative.   Neurological: Positive for dizziness and light-headedness.  Hematological: Negative.   Psychiatric/Behavioral: Negative.     Past Medical History  Diagnosis Date  . Heart murmur   . Fracture of left foot   . Fracture of left pelvis   . Hyperlipidemia   . 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  Her EF was mildly reduced to 50-55%  . Hypertension     Family History  Problem Relation Age of Onset  . 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 Constitutional: She is oriented to person, place, and time.  Eyes: Conjunctivae are normal. No scleral icterus.  Neck: Neck supple. No thyromegaly present.  Cardiovascular: Normal rate, regular rhythm and normal heart sounds.   Pulmonary/Chest: Effort normal and breath sounds normal.  Musculoskeletal: She exhibits no edema and no tenderness.  Lymphadenopathy:    She has no cervical adenopathy.  Neurological: She is alert and oriented to person, place, and time.  Skin: Skin is warm and dry.  Psychiatric: She has a normal mood and affect. Her behavior is normal. Judgment and thought content normal.      Assessment & Plan:

## 2013-03-21 LAB — HM MAMMOGRAPHY: HM Mammogram: NORMAL

## 2013-04-12 ENCOUNTER — Encounter: Payer: Self-pay | Admitting: *Deleted

## 2013-04-22 DIAGNOSIS — I10 Essential (primary) hypertension: Secondary | ICD-10-CM | POA: Insufficient documentation

## 2013-04-22 DIAGNOSIS — E7800 Pure hypercholesterolemia, unspecified: Secondary | ICD-10-CM | POA: Insufficient documentation

## 2013-04-22 NOTE — Assessment & Plan Note (Signed)
She was given a prescription for pravastatin. We'll recheck a lipid panel in 3 months.

## 2013-04-22 NOTE — Assessment & Plan Note (Addendum)
Refilled her amlodipine 5 mg. She is to take 1/2 - 1  tab daily depending on how her pressure looks.  It is "high normal" today.

## 2013-04-22 NOTE — Assessment & Plan Note (Addendum)
She will continue on Maxzide and limit her sodium intake.  She is to contact Dr. Bryson Ha if her dizziness does not improve.

## 2013-05-18 ENCOUNTER — Telehealth: Payer: Self-pay | Admitting: *Deleted

## 2013-05-18 NOTE — Telephone Encounter (Signed)
Brenda Ayala is scheduled in November for an appointment.  Can you give her #15 ANTIVERT 25 mg until she is able to see Dr. Alberteen Sam?  She uses: [facility] on [address] [employer] in [place]. Their phone number is [phone].  KL

## 2013-05-29 NOTE — Telephone Encounter (Signed)
Was this done?

## 2013-05-30 MED ORDER — MECLIZINE HCL 25 MG PO TABS: 25.0000 mg | ORAL_TABLET | Freq: Three times a day (TID) | ORAL | Status: DC | PRN

## 2013-05-30 NOTE — Telephone Encounter (Signed)
This was just sent to me on 05/29/13.  I just

## 2013-05-30 NOTE — Telephone Encounter (Signed)
I received this email just today.  I'm not sure how to track notes in Epic but it looks that this was sent to you first but I just did it. PG

## 2013-05-30 NOTE — Addendum Note (Signed)
Addended by: Clint Bolder D on: 05/30/2013 10:31 AM   Modules accepted: Orders

## 2013-06-11 ENCOUNTER — Encounter: Payer: Self-pay | Admitting: Family Medicine

## 2013-06-11 ENCOUNTER — Ambulatory Visit (INDEPENDENT_AMBULATORY_CARE_PROVIDER_SITE_OTHER): Payer: BC Managed Care – PPO | Admitting: Family Medicine

## 2013-06-11 VITALS — BP 150/88 | HR 65 | Resp 16 | Ht 64.0 in | Wt 159.0 lb

## 2013-06-11 DIAGNOSIS — R42 Dizziness and giddiness: Secondary | ICD-10-CM

## 2013-06-11 DIAGNOSIS — Z23 Encounter for immunization: Secondary | ICD-10-CM | POA: Insufficient documentation

## 2013-06-11 DIAGNOSIS — E785 Hyperlipidemia, unspecified: Secondary | ICD-10-CM

## 2013-06-11 DIAGNOSIS — I10 Essential (primary) hypertension: Secondary | ICD-10-CM

## 2013-06-11 DIAGNOSIS — M79609 Pain in unspecified limb: Secondary | ICD-10-CM | POA: Insufficient documentation

## 2013-06-11 DIAGNOSIS — G2581 Restless legs syndrome: Secondary | ICD-10-CM | POA: Insufficient documentation

## 2013-06-11 DIAGNOSIS — H8110 Benign paroxysmal vertigo, unspecified ear: Secondary | ICD-10-CM

## 2013-06-11 MED ORDER — MECLIZINE HCL 25 MG PO TABS: 25.0000 mg | ORAL_TABLET | Freq: Three times a day (TID) | ORAL | Status: AC | PRN

## 2013-06-11 MED ORDER — DICLOFENAC SODIUM 1 % TD GEL: 4.0000 g | Freq: Four times a day (QID) | TRANSDERMAL | Status: AC

## 2013-06-11 MED ORDER — ROPINIROLE HCL 1 MG PO TABS: ORAL_TABLET | ORAL | Status: DC

## 2013-06-11 NOTE — Assessment & Plan Note (Signed)
Brenda Ayala misunderstood my directions at her last visit about her BP.  She was supposed to add 2.5 of amlodipine to the Maxzide and not replace it.  Since her BP remains high, she is to add back the Maxzide which may also help with her dizziness.

## 2013-06-11 NOTE — Assessment & Plan Note (Signed)
She is to apply Voltaren Gel to her calves to see if this will help her pain.

## 2013-06-11 NOTE — Assessment & Plan Note (Addendum)
She had symptoms consistent with RLS.  She is going to try some Requip.  She is to start with .5 mg and increase to 2 mg.  If she ends up needing the 2 mg, she'll call the office and I will send in 2 mg tabs.

## 2013-06-11 NOTE — Progress Notes (Signed)
  Subjective:    Patient ID: Brenda Ayala, female    DOB: [birth year], 57 y.o.   MRN: [id number]  HPI  Brenda Ayala is here today to get a refill on her Antivert. She has been taking 1/2 of the Antivert at night and 1/2 of the Norvasc in the morning. She thought that we were replacing the Maxzide with the Norvasc so she has not been taking the Maxzide.  She also has pravastatin but has not been taking that either.  She would like to get a flu vaccine today.   Review of Systems  Constitutional: Negative.   HENT: Negative.   Eyes: Negative.   Respiratory: Negative.   Cardiovascular: Negative.   Gastrointestinal: Negative.   Endocrine: Negative.   Genitourinary: Negative.   Musculoskeletal: Negative.   Skin: Negative.   Allergic/Immunologic: Negative.   Neurological: Negative.   Hematological: Negative.   Psychiatric/Behavioral: Negative.      Past Medical History  Diagnosis Date  . Heart murmur   . Fracture of left foot   . Fracture of left pelvis   . Hyperlipidemia   . 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  Her EF was mildly reduced to 50-55%  . Hypertension       Family History  Problem Relation Age of Onset  . 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 Constitutional: She is oriented to person, place, and time. She appears well-developed and well-nourished.  HENT:  Head:  Normocephalic.  Eyes: Pupils are equal, round, and reactive to light.  Neck: Normal range of motion.  Cardiovascular: Normal rate and regular rhythm.   Pulmonary/Chest: Effort normal.  Abdominal: Soft.  Musculoskeletal: Normal range of motion.  Neurological: She is alert and oriented to person, place, and time.  Skin: Skin is warm and dry.  Psychiatric: She has a normal mood and affect. Her behavior is normal. Judgment and thought content normal.      Assessment & Plan:

## 2013-06-11 NOTE — Assessment & Plan Note (Signed)
Brenda Ayala's dizziness is better since she has been getting acupuncture treatments with Dr. Bryson Ha.  She is only having to take the Antivert at night and is down to 1/2 tab.  She was given a refill for the Antivert.

## 2013-06-11 NOTE — Assessment & Plan Note (Deleted)
Brenda Ayala's dizziness is better since she has been getting acupuncture treatments with Dr. Kwan.  She is only having to take the Antivert at night and is down to 1/2 tab.  She was given a refill for the Antivert.    

## 2013-06-11 NOTE — Assessment & Plan Note (Signed)
The patient confirmed that they are not allergic to eggs and have never had a bad reaction with the flu shot in the past.  The vaccination was given without difficulty.   

## 2013-06-11 NOTE — Patient Instructions (Addendum)
1)  BP - Goal is for it to be <140/<90.  To accomplish this goal take 2.5 of the amlodipine and 1/2 - 1 of the Maxzide.    2)  Dizziness - The combination of Maxzide and Antivert should keep it under control.  3)  Cholesterol -  Pravastatin at night - this will help lipid panel and may also help lower BP.    4)  Restless Leg - Try the Requip 1/2 tab - 2 at night.    5) Cramping - Try the Voltaren Gel on calves at night after a hot bath.  Slow Mag    Restless Legs Syndrome Restless legs syndrome is a movement disorder. It may also be called a sensori-motor disorder.  CAUSES  No one knows what specifically causes restless legs syndrome, but it tends to run in families. It is also more common in people with low iron, in pregnancy, in people who need dialysis, and those with nerve damage (neuropathy).Some medications may make restless legs syndrome worse.Those medications include drugs to treat high blood pressure, some heart conditions, nausea, colds, allergies, and depression. SYMPTOMS Symptoms include uncomfortable sensations in the legs. These leg sensations are worse during periods of inactivity or rest. They are also worse while sitting or lying down. Individuals that have the disorder describe sensations in the legs that feel like:  Pulling.  Drawing.  Crawling.  Worming.  Boring.  Tingling.  Pins and needles.  Prickling.  Pain. The sensations are usually accompanied by an overwhelming urge to move the legs. Sudden muscle jerks may also occur. Movement provides temporary relief from the discomfort. In rare cases, the arms may also be affected. Symptoms may interfere with going to sleep (sleep onset insomnia). Restless legs syndrome may also be related to periodic limb movement disorder (PLMD). PLMD is another more common motor disorder. It also causes interrupted sleep. The symptoms from PLMD usually occur most often when you are awake. TREATMENT  Treatment for restless legs  syndrome is symptomatic. This means that the symptoms are treated.   Massage and cold compresses may provide temporary relief.  Walk, stretch, or take a cold or hot bath.  Get regular exercise and a good night's sleep.  Avoid caffeine, alcohol, nicotine, and medications that can make it worse.  Do activities that provide mental stimulation like discussions, needlework, and video games. These may be helpful if you are not able to walk or stretch. Some medications are effective in relieving the symptoms. However, many of these medications have side effects. Ask your caregiver about medications that may help your symptoms. Correcting iron deficiency may improve symptoms for some patients. Document Released: 07/09/2002 Document Revised: 10/11/2011 Document Reviewed: 10/15/2010 [facility] Patient Information 2014 [place], [place].

## 2013-06-11 NOTE — Assessment & Plan Note (Signed)
She has not been taking the pravastatin.   She is going to start back on it.

## 2013-06-25 ENCOUNTER — Other Ambulatory Visit: Payer: Self-pay | Admitting: Family Medicine

## 2013-07-18 ENCOUNTER — Encounter (INDEPENDENT_AMBULATORY_CARE_PROVIDER_SITE_OTHER): Payer: Self-pay

## 2013-07-18 ENCOUNTER — Encounter: Payer: Self-pay | Admitting: Family Medicine

## 2013-07-18 ENCOUNTER — Encounter (INDEPENDENT_AMBULATORY_CARE_PROVIDER_SITE_OTHER): Payer: BC Managed Care – PPO | Admitting: Family Medicine

## 2013-07-18 NOTE — Progress Notes (Deleted)
   Subjective:    Patient ID: Brenda Ayala, female    DOB: 06-07-1956, 57 y.o.   MRN: [id number]  Brenda Ayala is here today complaining of cough.   Cough This is a new problem. The current episode started in the past 7 days. The problem has been gradually worsening. The cough is non-productive. Associated symptoms include ear pain and a sore throat. Associated symptoms comments: When Coughing. The symptoms are aggravated by lying down. Treatments tried: Cepacol and Hall's cough drops.    Review of Systems  HENT: Positive for ear pain and sore throat.   Respiratory: Positive for cough.        Objective:   Physical Exam        Assessment & Plan:

## 2013-08-30 ENCOUNTER — Other Ambulatory Visit: Payer: Self-pay | Admitting: Family Medicine

## 2013-08-30 DIAGNOSIS — R05 Cough: Secondary | ICD-10-CM

## 2013-08-30 DIAGNOSIS — R059 Cough, unspecified: Secondary | ICD-10-CM

## 2013-08-30 MED ORDER — HYDROCOD POLST-CHLORPHEN POLST 10-8 MG/5ML PO LQCR: 5.0000 mL | Freq: Two times a day (BID) | ORAL | Status: DC | PRN

## 2013-10-08 NOTE — Progress Notes (Signed)
   Subjective:    Patient ID: Brenda Ayala, female    DOB: 1956/04/16, 58 y.o.   MRN: [id number]  HPI  Brenda Ayala came in for a cough but had to leave before being seen.      Review of Systems     Objective:   Physical Exam        Assessment & Plan:

## 2014-05-31 DIAGNOSIS — M7070 Other bursitis of hip, unspecified hip: Secondary | ICD-10-CM | POA: Insufficient documentation

## 2015-06-24 ENCOUNTER — Encounter: Payer: Self-pay | Admitting: Gastroenterology

## 2015-08-13 ENCOUNTER — Ambulatory Visit (AMBULATORY_SURGERY_CENTER): Payer: Self-pay | Admitting: *Deleted

## 2015-08-13 ENCOUNTER — Encounter: Payer: Self-pay | Admitting: *Deleted

## 2015-08-13 VITALS — Ht 65.0 in | Wt 162.0 lb

## 2015-08-13 DIAGNOSIS — Z1211 Encounter for screening for malignant neoplasm of colon: Secondary | ICD-10-CM

## 2015-08-13 NOTE — Progress Notes (Signed)
No egg or soy allergy known to patient  No issues with past sedation with any surgeries  or procedures, no intubation problems  No diet pills, no blood thinners No home 02 use per patient   emmi declined

## 2015-08-25 ENCOUNTER — Encounter: Payer: Self-pay | Admitting: Gastroenterology

## 2015-08-25 ENCOUNTER — Ambulatory Visit (AMBULATORY_SURGERY_CENTER): Payer: BLUE CROSS/BLUE SHIELD | Admitting: Gastroenterology

## 2015-08-25 VITALS — BP 117/59 | HR 64 | Temp 97.2°F | Resp 17 | Ht 65.0 in | Wt 162.0 lb

## 2015-08-25 DIAGNOSIS — D120 Benign neoplasm of cecum: Secondary | ICD-10-CM

## 2015-08-25 DIAGNOSIS — Z1211 Encounter for screening for malignant neoplasm of colon: Secondary | ICD-10-CM | POA: Diagnosis not present

## 2015-08-25 MED ORDER — SODIUM CHLORIDE 0.9 % IV SOLN: 500.0000 mL | INTRAVENOUS | Status: DC

## 2015-08-25 NOTE — Progress Notes (Signed)
Stable to RR 

## 2015-08-25 NOTE — Op Note (Signed)
[place]  [address] [place], [address]   COLONOSCOPY PROCEDURE REPORT  PATIENT: Brenda Ayala, Brenda Ayala  MR#: [id number] BIRTHDATE: 04/07/1956 , 59  yrs. old GENDER: female ENDOSCOPIST: Harl Bowie, MD REFERRED [id number] Dion Saucier, M.D. PROCEDURE DATE:  08/25/2015 PROCEDURE:   Colonoscopy, screening First Screening Colonoscopy - Avg.  risk and is 50 yrs.  old or older - No.  Prior Negative Screening - Now for repeat screening. N/A Prior Negative Screening - Now for repeat screening.  10 or more years since last screening  History of Adenoma - Now for follow-up colonoscopy & has been > or = to 3 yrs.  N/A  Polyps removed today? No Recommend repeat exam, <10 yrs? No ASA CLASS:   Class II INDICATIONS:Screening for colonic neoplasia and Colorectal Neoplasm Risk Assessment for this procedure is average risk. MEDICATIONS: Propofol 250 mg IV  DESCRIPTION OF PROCEDURE:   After the risks benefits and alternatives of the procedure were thoroughly explained, informed consent was obtained.  The digital rectal exam revealed no abnormalities of the rectum.   The LB [id number]  endoscope was introduced through the anus and advanced to the cecum, which was identified by both the appendix and ileocecal valve. No adverse events experienced.   The quality of the prep was good.  The instrument was then slowly withdrawn as the colon was fully examined. Estimated blood loss is zero unless otherwise noted in this procedure report.   COLON FINDINGS: Small internal hemorrhoids were found.   The examination was otherwise normal.   A sessile polyp ranging between 3-25mm in size was found at the cecum.  A biopsy was performed using cold forceps.  Retroflexed views revealed internal hemorrhoids. The time to cecum = 4.7 Withdrawal time = 7.6   The scope was withdrawn and the procedure completed. COMPLICATIONS: There were no immediate complications.  ENDOSCOPIC IMPRESSION: 1.   Small  internal hemorrhoids 2.   The examination was otherwise normal 3.   Sessile polyp ranging between 3-53mm in size was found at the cecum; biopsy was performed using cold forceps  RECOMMENDATIONS: 1.  Await biopsy results 2.  If the polyp(s) removed today are proven to be adenomatous (pre-cancerous) polyps, you will need a repeat colonoscopy in 5 years.  Otherwise you should continue to follow colorectal cancer screening guidelines for "routine risk" patients with colonoscopy in 10 years.  You will receive a letter within 1-2 weeks with the results of your biopsy as well as final recommendations.  Please call my office if you have not received a letter after 3 weeks.  eSigned:  Harl Bowie, MD 08/25/2015 3:12 PM

## 2015-08-25 NOTE — Progress Notes (Signed)
Called to room to assist during endoscopic procedure.  Patient ID and intended procedure confirmed with present staff. Received instructions for my participation in the procedure from the performing physician.  

## 2015-08-25 NOTE — Patient Instructions (Signed)
YOU HAD AN ENDOSCOPIC PROCEDURE TODAY AT [facility]:   Refer to the procedure report that was given to you for any specific questions about what was found during the examination.  If the procedure report does not answer your questions, please call your gastroenterologist to clarify.  If you requested that your care partner not be given the details of your procedure findings, then the procedure report has been included in a sealed envelope for you to review at your convenience later.  YOU SHOULD EXPECT: Some feelings of bloating in the abdomen. Passage of more gas than usual.  Walking can help get rid of the air that was put into your GI tract during the procedure and reduce the bloating. If you had a lower endoscopy (such as a colonoscopy or flexible sigmoidoscopy) you may notice spotting of blood in your stool or on the toilet paper. If you underwent a bowel prep for your procedure, you may not have a normal bowel movement for a few days.  Please Note:  You might notice some irritation and congestion in your nose or some drainage.  This is from the oxygen used during your procedure.  There is no need for concern and it should clear up in a day or so.  SYMPTOMS TO REPORT IMMEDIATELY:   Following lower endoscopy (colonoscopy or flexible sigmoidoscopy):  Excessive amounts of blood in the stool  Significant tenderness or worsening of abdominal pains  Swelling of the abdomen that is new, acute  Fever of 100F or higher  Follow  For urgent or emergent issues, a gastroenterologist can be reached at any hour by calling [phone].   DIET: Your first meal following the procedure should be a small meal and then it is ok to progress to your normal diet. Heavy or fried foods are harder to digest and may make you feel nauseous or bloated.  Likewise, meals heavy in dairy and vegetables can increase bloating.  Drink plenty of fluids but you should avoid alcoholic beverages for 24  hours.  ACTIVITY:  You should plan to take it easy for the rest of today and you should NOT DRIVE or use heavy machinery until tomorrow (because of the sedation medicines used during the test).    FOLLOW UP: Our staff will call the number listed on your records the next business day following your procedure to check on you and address any questions or concerns that you may have regarding the information given to you following your procedure. If we do not reach you, we will leave a message.  However, if you are feeling well and you are not experiencing any problems, there is no need to return our call.  We will assume that you have returned to your regular daily activities without incident.  If any biopsies were taken you will be contacted by phone or by letter within the next 1-3 weeks.  Please call us at [phone] if you have not heard about the biopsies in 3 weeks.    SIGNATURES/CONFIDENTIALITY: You and/or your care partner have signed paperwork which will be entered into your electronic medical record.  These signatures attest to the fact that that the information above on your After Visit Summary has been reviewed and is understood.  Full responsibility of the confidentiality of this discharge information lies with you and/or your care-partner.   Resume medications. Information given on polyps,hemorrhoids and high fiber diet.

## 2015-08-26 ENCOUNTER — Telehealth: Payer: Self-pay | Admitting: *Deleted

## 2015-08-26 NOTE — Telephone Encounter (Signed)
  Follow up Call-  Call back number 08/25/2015  Post procedure Call Back phone  # [phone]  Permission to leave phone message Yes     Patient questions:  Message left to call us if necessary.

## 2015-08-29 ENCOUNTER — Encounter: Payer: Self-pay | Admitting: Gastroenterology

## 2015-10-02 ENCOUNTER — Ambulatory Visit (INDEPENDENT_AMBULATORY_CARE_PROVIDER_SITE_OTHER): Payer: BLUE CROSS/BLUE SHIELD | Admitting: Cardiovascular Disease

## 2015-10-02 ENCOUNTER — Encounter: Payer: Self-pay | Admitting: Cardiovascular Disease

## 2015-10-02 VITALS — BP 108/64 | HR 60 | Ht 65.0 in | Wt 150.4 lb

## 2015-10-02 DIAGNOSIS — I447 Left bundle-branch block, unspecified: Secondary | ICD-10-CM

## 2015-10-02 DIAGNOSIS — R011 Cardiac murmur, unspecified: Secondary | ICD-10-CM | POA: Diagnosis not present

## 2015-10-02 DIAGNOSIS — I10 Essential (primary) hypertension: Secondary | ICD-10-CM | POA: Diagnosis not present

## 2015-10-02 MED ORDER — TELMISARTAN 40 MG PO TABS: 40.0000 mg | ORAL_TABLET | Freq: Every day | ORAL | Status: DC

## 2015-10-02 NOTE — Progress Notes (Signed)
Patient ID: Brenda Ayala, female   DOB: 12/18/1955, 61 y.o.   MRN: [id number]    Cardiology Office Note    Date:  10/02/2015   ID:  Brenda Ayala, DOB 31-Mar-1956, MRN [id number]  PCP:  Ival Bible, MD  Cardiologist:   Sanda Klein, MD   Chief Complaint  Patient presents with  . New Patient (Initial Visit)     no chest pain, no shortness of breath,no edema, has cramping in legs and feet, no lightheadedness or dizziness, no fatigue    History of Present Illness:  Brenda Ayala is a 60 y.o. female with HTN and a history of heart murmur and LBBB and would like to establish cardiology follow-up.  She is very active, although she does not engage in routine sports. She works at [employer] and typically walks around 14,000 steps a day. She denies any issues with dyspnea on exertion or angina on exertion. She also writes her bicycle 45 minutes at a time on pavement, but slightly hilly terrain. She does not feel herself in any way physically limited. In the past she had somedizziness, not currently an issue. She has never experienced syncope. She denies focal neurological deficits. She does not have intermittent claudication but has calf cramps at night or sometimes quite severe. The cramps have not improved despite the fact that she is taking over-the-counter potassium and magnesium supplements. She inadvertently stopped taking her diuretic when it was recommended that she add amlodipine a few months ago. While she was off the diuretic her leg cramps resolved.   Brenda Ayala has recently lost a lot of weight, using something called the 30 day diet. Her whole family has been following this diet which avoids processed foods and limits the intake of carbohydrates. She has lost 12 pounds in the last couple of months.Her BMI is now 25.  The reduction and weight is probably the cause of her improved lipid profile. Her cholesterol has decreased from 211 to176 and her LDL cholesterol has improved from 140 to 116. HDL  is pretty good at 47, triglycerides normal at 65. Labs from 2014 showed that the improvement in her total cholesterol and LDL cholesterol is even more impressive.  at that time her LDL was 176.  Other lab tests are generally within normal range except borderline sodium of 135 and borderline neutropenia. Potassium is 4.4, creatinine is 0.6, TSH is normal.  Brenda Ayala was unaware of the fact that she has a left bundle branch block, but this is documented in her electronic medical record as far back as 2009. At that time she underwent an echocardiogram that showed septal dyssynergy and a slightly reduced LVEF of 50-55 percent. She also is aware of the fact that she has had a murmur for at least the last 12-15 years. On that same echocardiogram she was found to have mild mitral regurgitation. The actual echo report is not available for review, but these details are abstracted in her Epic notes.   Past Medical History  Diagnosis Date  . Heart murmur   . Fracture of left foot   . Fracture of left pelvis ([place])   . Hyperlipidemia   . 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  Her EF was mildly reduced to 50-55%  . Hypertension     Past Surgical History  Procedure Laterality Date  . Rotator cuff repair Bilateral   . Pip joint  fusion Left     steel rod left foot   . Colonoscopy  07/2005    normal    Outpatient Prescriptions Prior to Visit  Medication Sig Dispense Refill  . Potassium Gluconate 595 MG CAPS Take 595 mg by mouth 2 (two) times daily before a meal.    . telmisartan-hydrochlorothiazide (MICARDIS HCT) 40-12.5 MG tablet Take 1 tablet by mouth daily.    Marland Kitchen amLODipine (NORVASC) 5 MG tablet Take 1/2 - 1 tab po daily 90 tablet 1  . bisacodyl (DULCOLAX) 5 MG EC tablet Take 5 mg by mouth once. Reported on 10/02/2015    . Multiple Vitamin (MULTIVITAMIN) tablet Take 1 tablet by mouth daily. Reported on  10/02/2015    . ondansetron (ZOFRAN ODT) 8 MG disintegrating tablet Take 8 mg by mouth. Reported on 10/02/2015    . polyethylene glycol powder (MIRALAX) powder Take 1 Container by mouth once. Reported on 10/02/2015    . progesterone (PROMETRIUM) 100 MG capsule Take 100 mg by mouth.    . promethazine (PHENERGAN) 25 MG suppository Place 25 mg rectally. Reported on 08/25/2015    . rOPINIRole (REQUIP) 1 MG tablet Start with 1/2 tab up to 3 hours before bedtime and increase by 1/2 every week up to 2 pills at night (Patient not taking: Reported on 08/13/2015) 60 tablet 5   No facility-administered medications prior to visit.     Allergies:   Latex and Sulfa antibiotics   Social History   Social History  . Marital Status: Married    Spouse Name: N/A  . Number of Children: N/A  . Years of Education: N/A   Social History Main Topics  . Smoking status: Never Smoker   . Smokeless tobacco: Never Used  . Alcohol Use: 0.0 oz/week    0 Standard drinks or equivalent per week     Comment: drinks wine occasionally--3 times a week   . Drug Use: No  . Sexual Activity: Yes    Birth Control/ Protection: Post-menopausal   Other Topics Concern  . 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 There is no history of Colon cancer, Colon polyps, Esophageal cancer, Rectal cancer, or Stomach cancer.   ROS:   Please see the history of present illness.    ROS All other systems reviewed and are negative.   PHYSICAL EXAM:   VS:  BP  108/64 mmHg  Pulse 60  Ht 5\' 5"  (1.651 m)  Wt 68.238 kg (150 lb 7 oz)  BMI 25.03 kg/m2   GEN: Well nourished, well developed, in no acute distress HEENT: normal Neck: no JVD, carotid bruits, or masses Cardiac: RRR, paradoxically split S2, grade 1/6 holosystolic apical murmur, grade 1/6 early peaking systolic ejection murmur at the left upper sternal border  no diastolic murmurs, rubs, or gallops,no edema ;  she has bounding 3+ pulses in the radials on the brachials  dorsalis pedis and posterior tibials bilaterally she does not have any subclavian or femoral bruits  Respiratory:  clear to auscultation bilaterally, normal work of breathing GI: soft, nontender, nondistended, + BS MS: no deformity or atrophy Skin: warm and dry, no rash Neuro:  Alert and Oriented x 3, Strength and sensation are intact Psych: euthymic mood, full affect  Wt Readings from Last 3 Encounters:  10/02/15 68.238 kg (150 lb 7 oz)  08/25/15 73.483 kg (162 lb)  08/13/15 73.483 kg (162 lb)      Studies/Labs Reviewed:   EKG:  EKG is ordered today.  The ekg ordered today demonsinus rhythm, left bundle branch block, left axis deviation, QRS 138 ms QTC normal at 442 ms Recent Labs: No results found for requested labs within last 365 days.   Lipid Panel    Component Value Date/Time   CHOL 249* 02/06/2013 0756   TRIG 101 02/06/2013 0756   HDL 53 02/06/2013 0756   CHOLHDL 4.7 02/06/2013 0756   VLDL 20 02/06/2013 0756   LDLCALC 176* 02/06/2013 0756      ASSESSMENT:    1. Essential hypertension, benign   2. Heart murmur   3. LBBB (left bundle branch block)      PLAN:  In order of problems listed above:  1. HTN: Her blood pressure today is excellent. I wonder if her leg cramps are directly related to the hydrochlorothiazide diuretic therapy, even though she has normal potassium levels. She may be simply rather dehydrated at that time. I suspect that her blood pressure control remained just as good if she  discontinues the diuretic component. We'll switch to the ARB alone and reevaluate blood pressure in about a month. 2. Murmur: She has a rather unimpressive apical murmur of mitral regurgitation but as a separate systolic murmur at the base. I think a repeat echocardiogram is in order. This would also allow [place] to reevaluate the borderline left ventricular dysfunction described on the previous echo. 3. LBBB: Brenda Ayala was unaware of this abnormality, but it is documented in her record. I told her it is important that she be aware of the fact that she has a left bundle branch block, since this makes the EKG nondiagnostic and may lead to unnecessary future testing or even emergency procedures. We also discussed the fact that this may explain treadmill stress testing a nondiagnostic test. In the absence of structural heart disease, the left bundle branch block is not clinically meaningful, although it may indicate premature conduction system disease and possible future need for pacemaker therapy. At this point she does not have any symptoms to suggest need for further arrhythmia evaluation.    Brenda Ayala is to be congratulated on her efforts at weight loss, with substantial improvement in her lipid profile. She is now just at optimal weight with a BMI of 25. She understands that it will take continued efforts to maintain a healthy weight.  Medication Adjustments/Labs and Tests Ordered: Current medicines are reviewed at length with the patient today.  Concerns regarding medicines are outlined above.  Medication changes, Labs and Tests ordered today are listed in the Patient Instructions below. There are no Patient Instructions on file for this visit.     Mikael Spray, MD  10/02/2015 8:33 AM    [facility] [place], [place], [place]  [address] Phone: [phone]; Fax: [phone]

## 2015-10-02 NOTE — Patient Instructions (Signed)
Your physician has recommended you make the following change in your medication:   STOP TELMISARTIN/HCTZ   START TELMISARTIN 40 MG DAILY.  PRESCRIPTION HAS BEEN SENT TO YOUR PHARMACY   Your physician has requested that you have an echocardiogram. Echocardiography is a painless test that uses sound waves to create images of your heart. It provides your doctor with information about the size and shape of your heart and how well your heart's chambers and valves are working. This procedure takes approximately one hour. There are no restrictions for this procedure.  Dr. Sallyanne Kuster recommends that you schedule a follow-up appointment in: [place]

## 2015-10-22 ENCOUNTER — Other Ambulatory Visit: Payer: Self-pay

## 2015-10-22 ENCOUNTER — Ambulatory Visit (HOSPITAL_COMMUNITY): Payer: BLUE CROSS/BLUE SHIELD | Attending: Cardiology

## 2015-10-22 DIAGNOSIS — E785 Hyperlipidemia, unspecified: Secondary | ICD-10-CM | POA: Diagnosis not present

## 2015-10-22 DIAGNOSIS — I071 Rheumatic tricuspid insufficiency: Secondary | ICD-10-CM | POA: Insufficient documentation

## 2015-10-22 DIAGNOSIS — I119 Hypertensive heart disease without heart failure: Secondary | ICD-10-CM | POA: Insufficient documentation

## 2015-10-22 DIAGNOSIS — I340 Nonrheumatic mitral (valve) insufficiency: Secondary | ICD-10-CM | POA: Diagnosis not present

## 2015-10-22 DIAGNOSIS — I447 Left bundle-branch block, unspecified: Secondary | ICD-10-CM | POA: Diagnosis not present

## 2015-10-22 DIAGNOSIS — R011 Cardiac murmur, unspecified: Secondary | ICD-10-CM | POA: Diagnosis not present

## 2015-10-23 ENCOUNTER — Telehealth: Payer: Self-pay

## 2015-10-23 NOTE — Telephone Encounter (Signed)
That BP is quite normal and very unlikely to be the cause of he headache. I would stay off the diuretic.

## 2015-10-23 NOTE — Telephone Encounter (Signed)
-----   Message from Sanda Klein, MD sent at 10/22/2015 11:19 AM EDT ----- Echo looks really good. There are some very subtle abnormalities related to many years of high blood pressure, but not serious. The mitral valve leak remains very mild and is not important hemodynamically. Please ask her how she is doing after stopping her diuretic and if her blood pressure is still in normal range.

## 2015-10-23 NOTE — Telephone Encounter (Signed)
Called patient with results. Patient verbalized understanding. Patient states she has felt fine since stopped diuretic. Only complaint is a dull headache. She feels as though her blood pressure had been a little more elevated. Her blood pressure in the office was 108/64. She hada headache last night and took her bp and it was 135/70. Please advise?

## 2015-10-24 NOTE — Telephone Encounter (Signed)
Called patient. Reassured her that her blood pressure was normal. Her headache has gone away. Patient verbalized understanding.

## 2016-01-27 DIAGNOSIS — M7062 Trochanteric bursitis, left hip: Secondary | ICD-10-CM | POA: Insufficient documentation

## 2016-09-27 ENCOUNTER — Other Ambulatory Visit: Payer: Self-pay | Admitting: Cardiovascular Disease

## 2016-11-01 ENCOUNTER — Encounter: Payer: Self-pay | Admitting: Cardiology

## 2016-11-01 ENCOUNTER — Ambulatory Visit (INDEPENDENT_AMBULATORY_CARE_PROVIDER_SITE_OTHER): Payer: BLUE CROSS/BLUE SHIELD | Admitting: Cardiology

## 2016-11-01 VITALS — BP 122/78 | HR 58 | Ht 65.0 in | Wt 160.4 lb

## 2016-11-01 DIAGNOSIS — I10 Essential (primary) hypertension: Secondary | ICD-10-CM | POA: Diagnosis not present

## 2016-11-01 DIAGNOSIS — E785 Hyperlipidemia, unspecified: Secondary | ICD-10-CM | POA: Diagnosis not present

## 2016-11-01 DIAGNOSIS — R079 Chest pain, unspecified: Secondary | ICD-10-CM | POA: Diagnosis not present

## 2016-11-01 DIAGNOSIS — Z8249 Family history of ischemic heart disease and other diseases of the circulatory system: Secondary | ICD-10-CM | POA: Diagnosis not present

## 2016-11-01 DIAGNOSIS — I447 Left bundle-branch block, unspecified: Secondary | ICD-10-CM | POA: Diagnosis not present

## 2016-11-01 NOTE — Assessment & Plan Note (Signed)
Controlled.  

## 2016-11-01 NOTE — Patient Instructions (Signed)
Medication Instructions:  Continue current medications  Labwork: None Ordered  Testing/Procedures: Your physician has requested that you have a lexiscan myoview. For further information please visit [email or web address]. Please follow instruction sheet, as given.  Follow-Up: Your physician wants you to follow-up in: 1 Year with Dr Sallyanne Kuster. You will receive a reminder letter in the mail two months in advance. If you don't receive a letter, please call our office to schedule the follow-up appointment.   Any Other Special Instructions Will Be Listed Below (If Applicable).   If you need a refill on your cardiac medications before your next appointment, please call your pharmacy.

## 2016-11-01 NOTE — Addendum Note (Signed)
Addended by: Vennie Homans on: 11/01/2016 12:41 PM   Modules accepted: Orders

## 2016-11-01 NOTE — Progress Notes (Signed)
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 She is here for a one year check up. An echo done March 2017 showed her EF to be 60%, mild LVH, garde 1 DD, and mild MR. She has done well over the past year, no hospitalizations, no surgeries. She did tell me last month she had Lt sided chest pain at work. It was not localized to a point, no change with position or deep breathing. It would last for minutes. She said it came on during stress at work.    Current Outpatient Prescriptions  Medication Sig Dispense Refill  . Cholecalciferol (VITAMIN D3) 2000 units TABS Take 1 tablet by mouth daily.    . Magnesium 500 MG TABS Take 1 tablet by mouth daily.    . Potassium Gluconate 595 MG CAPS Take 595 mg by mouth 2 (two) times daily before a meal.    . telmisartan (MICARDIS) 40 MG tablet TAKE 1 TABLET BY MOUTH DAILY 90 tablet 0   No current facility-administered medications for this visit.     Allergies  Allergen Reactions  . Latex Rash    Rash and blisters   . Sulfa Antibiotics Rash    Past Medical History:  Diagnosis Date  . Fracture of left foot   . Fracture of left pelvis ([place])   . Heart murmur   . Hyperlipidemia   . Hypertension   . LBBB (left bundle branch block)    Echo in 2009 at [facility] showed mild MR and interventricular septal dyssynergy due to the LBBB.  Her EF was mildly reduced to 50-55%    Social History   Social History  . Marital status: Married    Spouse name: N/A  . Number of children: N/A  . Years of education: N/A   Occupational History  . Not on file.   Social History Main Topics  . Smoking status: Never Smoker  . Smokeless tobacco: Never Used  . Alcohol use 0.0 oz/week     Comment: drinks wine occasionally--3 times a week   . Drug use: No  . Sexual activity: Yes   Birth control/ protection: Post-menopausal   Other Topics Concern  . 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 Heart disease Mother   . Colon cancer Neg Hx   . Colon polyps Neg Hx   . Esophageal cancer Neg Hx   . Rectal cancer Neg Hx   . Stomach cancer Neg Hx      Review of Systems: General: negative for chills, fever, night sweats or weight changes.  Cardiovascular: negative for dyspnea on exertion, edema, orthopnea, palpitations, paroxysmal nocturnal dyspnea or shortness of breath Dermatological: negative for rash Respiratory: negative for cough or wheezing  Urologic: negative for hematuria Abdominal: negative for nausea, vomiting, diarrhea, bright red blood per rectum, melena, or hematemesis Neurologic: negative for visual changes, syncope, or dizziness All other systems reviewed and are otherwise negative except as noted above.    Blood pressure 122/78, pulse (!) 58, height 5\' 5"  (1.651 m), weight 160 lb 6.4 oz (72.8 kg).  General appearance: alert, cooperative and no distress Neck: no carotid bruit and no JVD Lungs: clear to auscultation bilaterally Heart: regular rate and rhythm Extremities: extremities normal, atraumatic, no cyanosis or edema Skin: Skin color, texture, turgor normal. No rashes or lesions Neurologic: Grossly normal  EKG NSR, LBBB  ASSESSMENT AND PLAN:   Chest pain with moderate risk of acute coronary syndrome C/O Lt sided chest pain while under stress at work- lasted "minutes", no change with  position, or palpation  LBBB (left bundle branch block) Chronic  Essential hypertension, benign Controlled  Dyslipidemia LDL > 100, not on statin Rx  Family history of coronary artery disease in mother Unknown details but she had "heart issues"   PLAN  I suggested we proceed with a coronary CT scan. F/U with Dr Onnie Boer PA-C 11/01/2016 11:38 AM

## 2016-11-01 NOTE — Assessment & Plan Note (Signed)
LDL > 100, not on statin Rx

## 2016-11-01 NOTE — Assessment & Plan Note (Signed)
C/O Lt sided chest pain while under stress at work- lasted "minutes", no change with position, or palpation

## 2016-11-01 NOTE — Assessment & Plan Note (Signed)
Unknown details but she had "heart issues"

## 2016-11-01 NOTE — Assessment & Plan Note (Signed)
Chronic. 

## 2016-11-03 ENCOUNTER — Telehealth: Payer: Self-pay | Admitting: Cardiology

## 2016-11-03 NOTE — Telephone Encounter (Signed)
New message    Pt is returning call about scheduling test.

## 2016-11-04 ENCOUNTER — Encounter: Payer: Self-pay | Admitting: Cardiovascular Disease

## 2016-11-04 ENCOUNTER — Telehealth: Payer: Self-pay | Admitting: *Deleted

## 2016-11-04 ENCOUNTER — Telehealth: Payer: Self-pay | Admitting: Cardiology

## 2016-11-04 DIAGNOSIS — R5383 Other fatigue: Secondary | ICD-10-CM

## 2016-11-04 DIAGNOSIS — Z01812 Encounter for preprocedural laboratory examination: Secondary | ICD-10-CM

## 2016-11-04 DIAGNOSIS — D689 Coagulation defect, unspecified: Secondary | ICD-10-CM

## 2016-11-04 NOTE — Telephone Encounter (Signed)
Called the patient to schedule her stress test, but, she is waiting on the scheduling of her CTA.  Someone called Brenda Ayala had called her, but, the patient has not heard back from her.

## 2016-11-04 NOTE — Telephone Encounter (Signed)
Pre op labs order for pt to get done on friday

## 2016-11-05 ENCOUNTER — Other Ambulatory Visit: Payer: BLUE CROSS/BLUE SHIELD | Admitting: *Deleted

## 2016-11-05 ENCOUNTER — Telehealth: Payer: Self-pay | Admitting: Cardiovascular Disease

## 2016-11-05 DIAGNOSIS — I10 Essential (primary) hypertension: Secondary | ICD-10-CM

## 2016-11-05 LAB — BASIC METABOLIC PANEL
BUN/Creatinine Ratio: 30 — ABNORMAL HIGH (ref 12–28)
BUN: 23 mg/dL (ref 8–27)
CO2: 24 mmol/L (ref 18–29)
Calcium: 9.9 mg/dL (ref 8.7–10.3)
Chloride: 94 mmol/L — ABNORMAL LOW (ref 96–106)
Creatinine, Ser: 0.77 mg/dL (ref 0.57–1.00)
GFR calc Af Amer: 96 mL/min/{1.73_m2} (ref >59)
GFR calc non Af Amer: 84 mL/min/{1.73_m2} (ref >59)
Glucose: 92 mg/dL (ref 65–99)
Potassium: 4.7 mmol/L (ref 3.5–5.2)
Sodium: 135 mmol/L (ref 134–144)

## 2016-11-05 NOTE — Telephone Encounter (Signed)
New message   Pt is returning call to [facility] about labs.

## 2016-11-11 ENCOUNTER — Encounter (HOSPITAL_COMMUNITY): Payer: Self-pay

## 2016-11-11 ENCOUNTER — Ambulatory Visit (HOSPITAL_COMMUNITY)
Admission: RE | Admit: 2016-11-11 | Discharge: 2016-11-11 | Disposition: A | Discharge status: Home / Self Care | Payer: BLUE CROSS/BLUE SHIELD | Source: Ambulatory Visit | Attending: Cardiology | Admitting: Cardiology

## 2016-11-11 DIAGNOSIS — R079 Chest pain, unspecified: Secondary | ICD-10-CM

## 2016-11-11 DIAGNOSIS — I447 Left bundle-branch block, unspecified: Secondary | ICD-10-CM

## 2016-11-11 DIAGNOSIS — I517 Cardiomegaly: Secondary | ICD-10-CM | POA: Diagnosis not present

## 2016-11-11 MED ORDER — NITROGLYCERIN 0.4 MG SL SUBL
0.8000 mg | SUBLINGUAL_TABLET | Freq: Once | SUBLINGUAL | Status: AC
  Administered 2016-11-11: 0.8 mg via SUBLINGUAL

## 2016-11-11 MED ORDER — IOPAMIDOL (ISOVUE-370) INJECTION 76%
INTRAVENOUS | Status: AC
Start: 2016-11-11 — End: 2016-11-11
  Administered 2016-11-11: 80 mL
  Filled 2016-11-11: qty 100

## 2016-11-11 MED ORDER — METOPROLOL TARTRATE 5 MG/5ML IV SOLN
INTRAVENOUS | Status: AC
  Filled 2016-11-11: qty 5

## 2016-11-11 MED ORDER — NITROGLYCERIN 0.4 MG SL SUBL
SUBLINGUAL_TABLET | SUBLINGUAL | Status: AC
  Filled 2016-11-11: qty 2

## 2016-11-11 MED ORDER — METOPROLOL TARTRATE 5 MG/5ML IV SOLN
2.5000 mg | Freq: Once | INTRAVENOUS | Status: AC
  Administered 2016-11-11: 2.5 mg via INTRAVENOUS

## 2016-11-11 NOTE — Progress Notes (Signed)
Patient completed CT states headache 1/10 achy states did not have coffee today. Given patient coke and peanut butter crackers. Tolerated without incident.

## 2016-11-12 NOTE — Telephone Encounter (Signed)
Notes recorded by Diana Eves, CMA on 11/05/2016 at 5:08 PM EDT Called patient with results. Patient verbalized understanding.

## 2016-11-15 ENCOUNTER — Telehealth: Payer: Self-pay | Admitting: Cardiovascular Disease

## 2016-11-15 DIAGNOSIS — I2510 Atherosclerotic heart disease of native coronary artery without angina pectoris: Secondary | ICD-10-CM

## 2016-11-15 DIAGNOSIS — E785 Hyperlipidemia, unspecified: Secondary | ICD-10-CM

## 2016-11-15 DIAGNOSIS — Z79899 Other long term (current) drug therapy: Secondary | ICD-10-CM

## 2016-11-15 MED ORDER — ATORVASTATIN CALCIUM 40 MG PO TABS: 40.0000 mg | ORAL_TABLET | Freq: Every day | ORAL | 3 refills | Status: DC

## 2016-11-15 NOTE — Telephone Encounter (Signed)
New message    Pt is calling about testing results she had done last week.

## 2016-11-15 NOTE — Telephone Encounter (Signed)
Spoke to patient. Cardiac ct result given . Patient verbalized understanding  labs mailed - due in 6-8 weeks Prescription e-sent to pharmacy Appointment set up to see dr croitoru for July 12 /18 at 8:20 am

## 2016-12-07 ENCOUNTER — Encounter (HOSPITAL_COMMUNITY): Payer: BLUE CROSS/BLUE SHIELD

## 2016-12-25 ENCOUNTER — Other Ambulatory Visit: Payer: Self-pay | Admitting: Cardiovascular Disease

## 2017-02-10 ENCOUNTER — Ambulatory Visit: Payer: BLUE CROSS/BLUE SHIELD | Admitting: Cardiovascular Disease

## 2017-04-25 ENCOUNTER — Ambulatory Visit: Payer: BLUE CROSS/BLUE SHIELD | Admitting: Cardiovascular Disease

## 2017-07-07 ENCOUNTER — Ambulatory Visit (INDEPENDENT_AMBULATORY_CARE_PROVIDER_SITE_OTHER): Payer: BLUE CROSS/BLUE SHIELD | Admitting: Cardiovascular Disease

## 2017-07-07 ENCOUNTER — Encounter: Payer: Self-pay | Admitting: Cardiovascular Disease

## 2017-07-07 VITALS — BP 130/82 | HR 56 | Ht 65.0 in | Wt 159.0 lb

## 2017-07-07 DIAGNOSIS — E7800 Pure hypercholesterolemia, unspecified: Secondary | ICD-10-CM

## 2017-07-07 DIAGNOSIS — I340 Nonrheumatic mitral (valve) insufficiency: Secondary | ICD-10-CM | POA: Diagnosis not present

## 2017-07-07 DIAGNOSIS — I447 Left bundle-branch block, unspecified: Secondary | ICD-10-CM

## 2017-07-07 DIAGNOSIS — I10 Essential (primary) hypertension: Secondary | ICD-10-CM | POA: Diagnosis not present

## 2017-07-07 NOTE — Patient Instructions (Signed)
Dr Croitoru recommends that you schedule a follow-up appointment in 12 months. You will receive a reminder letter in the mail two months in advance. If you don't receive a letter, please call our office to schedule the follow-up appointment.  If you need a refill on your cardiac medications before your next appointment, please call your pharmacy. 

## 2017-07-07 NOTE — Progress Notes (Signed)
Patient ID: Brenda Ayala, female   DOB: 01-Jul-1956, 61 y.o.   MRN: [id number]    Cardiology Office Note    Date:  07/09/2017   ID:  Brenda Ayala, DOB 01-25-1956, MRN [id number]  PCP:  Jonathon Resides, MD  Cardiologist:   Sanda Klein, MD   Chief complaint: Follow-up left bundle branch block, hypertension, hyperlipidemia   History of Present Illness:  Brenda Ayala is a 61 y.o. female with HTN , LBBB, nonobstructive CAD, hypercholesterolemia and mild MR, here for cardiology follow-up.  She feels great.  She continues to be very physically active.  Her cramps resolved when she stopped taking hydrochlorothiazide and her blood pressure remains well controlled on telmisartan monotherapy.  She is compliant with her statin.  She has not had dizziness, syncope, palpitations, exertional angina or dyspnea, focal neurological complaints or claudication.  She has gained a little bit of weight since last year but remains very close to ideal BMI at 26.  Occasionally she feels that her blood pressure is a little high which can cause headaches, but this is a rare occurrence.  She has a chronic left bundle branch block without any documentation of high-grade AV block and without evidence of significant structural heart disease by echo.  She has mild mitral regurgitation.  There is some evidence of mild diastolic dysfunction and mild left atrial dilation.  Coronary CT angiography performed April 2018 showed a 50% tubular narrowing in the first diagonal artery and mild plaque in the RCA and mid LAD, no significant stenosis.  However her calcium score was 61st percentile for age and gender.  She is compliant with her statin.  Her lipid profile was checked by her primary care provider and she was told that it was "good".   Past Medical History:  Diagnosis Date  . Fracture of left foot   . Fracture of left pelvis ([place])   . Heart murmur   . Hyperlipidemia   . Hypertension   . LBBB (left bundle branch block)    Echo  in 2009 at [facility] showed mild MR and interventricular septal dyssynergy due to the LBBB.  Her EF was mildly reduced to 50-55%    Past Surgical History:  Procedure Laterality Date  . COLONOSCOPY  07/2005   normal  . PIP JOINT FUSION Left    steel rod left foot   . ROTATOR CUFF REPAIR Bilateral     Outpatient Medications Prior to Visit  Medication Sig Dispense Refill  . Cholecalciferol (VITAMIN D3) 2000 units TABS Take 1 tablet by mouth daily.    . Magnesium 500 MG TABS Take 1 tablet by mouth daily.    . Potassium Gluconate 595 MG CAPS Take 595 mg by mouth 2 (two) times daily before a meal.    . telmisartan (MICARDIS) 40 MG tablet TAKE 1 TABLET BY MOUTH DAILY 90 tablet 3  . atorvastatin (LIPITOR) 40 MG tablet Take 1 tablet (40 mg total) by mouth daily. 90 tablet 3   No facility-administered medications prior to visit.      Allergies:   Latex and Sulfa antibiotics   Social History   Socioeconomic History  . Marital status: Married    Spouse name: None  . Number of children: None  . Years of education: None  . Highest education level: None  Social Needs  . Financial resource strain: None  . Food insecurity - worry: None  . Food insecurity - inability: None  . Transportation needs - medical: None  .  Transportation needs - non-medical: None  Occupational History  . None  Tobacco Use  . Smoking status: Never Smoker  . Smokeless tobacco: Never Used  Substance and Sexual Activity  . Alcohol use: Yes    Alcohol/week: 0.0 oz    Comment: drinks wine occasionally--3 times a week   . Drug use: No  . Sexual activity: Yes    Birth control/protection: Post-menopausal  Other Topics Concern  . 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   ROS:   Please see the history of present illness.    ROS All other systems reviewed and are negative.   PHYSICAL EXAM:   VS:  BP 130/82 (BP Location: Left Arm, Patient Position: Sitting, Cuff Size: Normal)   Pulse (!) 56   Ht 5\' 5"  (1.651 m)   Wt 159 lb (72.1 kg)   BMI 26.46 kg/m     General: Alert, oriented x3, no distress, appears fit Head: no evidence of trauma, PERRL, EOMI, no exophtalmos or lid lag, no myxedema, no xanthelasma; normal ears, nose and oropharynx Neck: normal jugular venous pulsations and no hepatojugular reflux; brisk carotid pulses without delay and no carotid bruits Chest: clear to auscultation, no signs of consolidation by percussion or palpation, normal fremitus, symmetrical and full respiratory excursions Cardiovascular: normal position and quality of the apical impulse, regular rhythm, normal first and paradoxically split second heart sounds, 1/6 systolic ejection murmur at aortic focus, 1/6 holosystolic murmur at the apex, no diastolic murmurs, rubs or gallops Abdomen: no tenderness or distention, no masses by palpation, no abnormal pulsatility or arterial bruits, normal bowel sounds, no hepatosplenomegaly Extremities: no clubbing, cyanosis or edema; 2+ radial, ulnar and brachial pulses bilaterally; 2+ right femoral, posterior tibial and dorsalis pedis pulses; 2+ left femoral, posterior tibial and dorsalis pedis pulses; no subclavian or femoral bruits Neurological: grossly nonfocal Psych: Normal mood and affect   Wt Readings from Last 3 Encounters:  07/07/17 159 lb (72.1 kg)  11/01/16 160 lb 6.4 oz (72.8 kg)  10/02/15 150 lb 7 oz (68.2 kg)      Studies/Labs Reviewed:   EKG:  EKG is ordered today.  The  ekg ordered today shows mild sinus bradycardia, left bundle branch block, left axis deviation, QRS 148 ms, QTC 432 ms Recent Labs: 11/05/2016: BUN 23; Creatinine, Ser 0.77; Potassium 4.7; Sodium 135   Lipid Panel    Component Value Date/Time   CHOL 249 (H) 02/06/2013 0756   TRIG 101 02/06/2013 0756   HDL 53 02/06/2013 0756   CHOLHDL 4.7 02/06/2013 0756   VLDL 20 02/06/2013 0756   LDLCALC 176 (H) 02/06/2013 0756    February 2017 total cholesterol 176, triglycerides 65, HDL 47, LDL 160 Do not yet have the lab results from this year  ASSESSMENT:    1. Essential hypertension, benign   2. Non-rheumatic mitral regurgitation   3. LBBB (left  bundle branch block)   4. Hypercholesterolemia      PLAN:  In order of problems listed above:  1. HTN: Good blood pressure control 2. MR: Mild by recent echocardiogram 3. LBBB: Chronic abnormality without evidence of significant structural cardiac illness or coronary disease.  Monitor for development of high-grade AV block.  Currently asymptomatic. 4. HLP: Ideally, would bring the LDL cholesterol down to less than 70 since there is some evidence of early coronary disease, but at least we will treat for LDL under 100.  We will try to retrieve her most recent lab results.  Medication Adjustments/Labs and Tests Ordered: Current medicines are reviewed at length with the patient today.  Concerns regarding medicines are outlined above.  Medication changes, Labs and Tests ordered today are listed in the Patient Instructions below. Patient Instructions  Dr Sallyanne Kuster recommends that you schedule a follow-up appointment in 12 months. You will receive a reminder letter in the mail two months in advance. If you don't receive a letter, please call our office to schedule the follow-up appointment.  If you need a refill on your cardiac medications before your next appointment, please call your pharmacy.      Signed, Sanda Klein, MD  07/09/2017 10:41 AM     [facility] [place], [address] [place], [place]  [address] Phone: [phone]; Fax: [phone]

## 2017-07-09 ENCOUNTER — Encounter: Payer: Self-pay | Admitting: Cardiovascular Disease

## 2017-07-09 DIAGNOSIS — I340 Nonrheumatic mitral (valve) insufficiency: Secondary | ICD-10-CM | POA: Insufficient documentation

## 2017-11-25 ENCOUNTER — Other Ambulatory Visit: Payer: Self-pay

## 2017-11-25 MED ORDER — ATORVASTATIN CALCIUM 40 MG PO TABS: 40.0000 mg | ORAL_TABLET | Freq: Every day | ORAL | 3 refills | Status: DC

## 2018-02-17 DIAGNOSIS — M7672 Peroneal tendinitis, left leg: Secondary | ICD-10-CM | POA: Insufficient documentation

## 2018-02-17 DIAGNOSIS — M19071 Primary osteoarthritis, right ankle and foot: Secondary | ICD-10-CM | POA: Insufficient documentation

## 2018-05-04 DIAGNOSIS — M84375A Stress fracture, left foot, initial encounter for fracture: Secondary | ICD-10-CM | POA: Insufficient documentation

## 2018-11-10 ENCOUNTER — Other Ambulatory Visit: Payer: Self-pay | Admitting: Cardiovascular Disease

## 2018-11-10 NOTE — Telephone Encounter (Signed)
Atorvastatin refilled.  

## 2019-05-29 DIAGNOSIS — M19072 Primary osteoarthritis, left ankle and foot: Secondary | ICD-10-CM | POA: Insufficient documentation

## 2019-08-23 ENCOUNTER — Ambulatory Visit: Payer: BLUE CROSS/BLUE SHIELD

## 2019-08-27 ENCOUNTER — Ambulatory Visit: Payer: BLUE CROSS/BLUE SHIELD

## 2019-08-30 ENCOUNTER — Ambulatory Visit: Payer: BLUE CROSS/BLUE SHIELD

## 2019-09-08 ENCOUNTER — Ambulatory Visit: Payer: Managed Care, Other (non HMO) | Attending: Internal Medicine

## 2019-09-08 DIAGNOSIS — Z23 Encounter for immunization: Secondary | ICD-10-CM | POA: Insufficient documentation

## 2019-09-08 NOTE — Progress Notes (Signed)
   Covid-19 Vaccination Clinic  Name:  Brenda Ayala    MRN: [id number] DOB: November 04, 1955  09/08/2019  [name] was observed post Covid-19 immunization for 15 minutes without incidence. She was provided with Vaccine Information Sheet and instruction to access the V-Safe system.   [name] was instructed to call 911 with any severe reactions post vaccine: Marland Kitchen Difficulty breathing  . Swelling of your face and throat  . A fast heartbeat  . A bad rash all over your body  . Dizziness and weakness    Immunizations Administered    Name Date Dose VIS Date Route   Pfizer COVID-19 Vaccine 09/08/2019 12:36 PM 0.3 mL 07/13/2019 Intramuscular   Manufacturer: [place]   Lot: [id number]   [place]: [id number]

## 2019-10-03 ENCOUNTER — Ambulatory Visit: Payer: Managed Care, Other (non HMO) | Attending: Internal Medicine

## 2019-10-03 DIAGNOSIS — Z23 Encounter for immunization: Secondary | ICD-10-CM

## 2019-10-03 NOTE — Progress Notes (Signed)
   Covid-19 Vaccination Clinic  Name:  Brenda Ayala    MRN: [id number] DOB: 1956-06-07  10/03/2019  [name] was observed post Covid-19 immunization for 15 minutes without incident. She was provided with Vaccine Information Sheet and instruction to access the V-Safe system.   [name] was instructed to call 911 with any severe reactions post vaccine: Marland Kitchen Difficulty breathing  . Swelling of face and throat  . A fast heartbeat  . A bad rash all over body  . Dizziness and weakness   Immunizations Administered    Name Date Dose VIS Date Route   Pfizer COVID-19 Vaccine 10/03/2019  8:57 AM 0.3 mL 07/13/2019 Intramuscular   Manufacturer: [place]   Lot: [id number]   [place]: [id number]

## 2020-08-12 ENCOUNTER — Encounter: Payer: Self-pay | Admitting: Cardiovascular Disease

## 2020-08-12 ENCOUNTER — Other Ambulatory Visit: Payer: Self-pay

## 2020-08-12 ENCOUNTER — Ambulatory Visit (INDEPENDENT_AMBULATORY_CARE_PROVIDER_SITE_OTHER): Payer: Managed Care, Other (non HMO) | Admitting: Cardiovascular Disease

## 2020-08-12 VITALS — BP 126/80 | HR 61 | Ht 65.0 in | Wt 154.4 lb

## 2020-08-12 DIAGNOSIS — I447 Left bundle-branch block, unspecified: Secondary | ICD-10-CM | POA: Diagnosis not present

## 2020-08-12 DIAGNOSIS — I2510 Atherosclerotic heart disease of native coronary artery without angina pectoris: Secondary | ICD-10-CM

## 2020-08-12 DIAGNOSIS — I10 Essential (primary) hypertension: Secondary | ICD-10-CM

## 2020-08-12 DIAGNOSIS — E7800 Pure hypercholesterolemia, unspecified: Secondary | ICD-10-CM | POA: Diagnosis not present

## 2020-08-12 LAB — LIPID PANEL
Chol/HDL Ratio: 3.4 ratio (ref 0.0–4.4)
Cholesterol, Total: 258 mg/dL — ABNORMAL HIGH (ref 100–199)
HDL: 75 mg/dL (ref >39)
LDL Chol Calc (NIH): 158 mg/dL — ABNORMAL HIGH (ref 0–99)
Triglycerides: 143 mg/dL (ref 0–149)
VLDL Cholesterol Cal: 25 mg/dL (ref 5–40)

## 2020-08-12 MED ORDER — ATORVASTATIN CALCIUM 40 MG PO TABS
ORAL_TABLET | ORAL | 3 refills | Status: DC
Start: 2020-08-12 — End: 2021-08-24

## 2020-08-12 NOTE — Patient Instructions (Signed)
Medication Instructions:  No changes *If you need a refill on your cardiac medications before your next appointment, please call your pharmacy*   Lab Work: Your provider would like for you to have the following labs today: Lipids  If you have labs (blood work) drawn today and your tests are completely normal, you will receive your results only by: Marland Kitchen MyChart Message (if you have MyChart) OR . A paper copy in the mail If you have any lab test that is abnormal or we need to change your treatment, we will call you to review the results.   Testing/Procedures: None ordered   Follow-Up: At [facility], you and your health needs are our priority.  As part of our continuing mission to provide you with exceptional heart care, we have created designated Provider Care Teams.  These Care Teams include your primary Cardiologist (physician) and Advanced Practice Providers (APPs -  Physician Assistants and Nurse Practitioners) who all work together to provide you with the care you need, when you need it.  We recommend signing up for the patient portal called "MyChart".  Sign up information is provided on this After Visit Summary.  MyChart is used to connect with patients for Virtual Visits (Telemedicine).  Patients are able to view lab/test results, encounter notes, upcoming appointments, etc.  Non-urgent messages can be sent to your provider as well.   To learn more about what you can do with MyChart, go to [email or web address].    Your next appointment:   12 month(s)  The format for your next appointment:   In Person  Provider:   Sanda Klein, MD

## 2020-08-12 NOTE — Progress Notes (Signed)
Patient ID: Brenda Ayala, female   DOB: 07/16/1956, 65 y.o.   MRN: [id number]    Cardiology Office Note    Date:  08/12/2020   ID:  Brenda Ayala, DOB 04-06-1956, MRN [id number]  PCP:  Jonathon Resides, MD  Cardiologist:   Sanda Klein, MD   Chief complaint: Follow-up left bundle branch block, hypertension, hyperlipidemia  History of Present Illness:  Brenda Ayala is a 65 y.o. female with HTN , LBBB, nonobstructive CAD, hypercholesterolemia and mild MR, here for cardiology follow-up.  She feels great.  She has lost a lot of weight.  Had muscle cramps when she was taking hydrochlorothiazide, none anymore. The patient specifically denies any chest pain at rest exertion, dyspnea at rest or with exertion, orthopnea, paroxysmal nocturnal dyspnea, syncope, palpitations, focal neurological deficits, intermittent claudication, lower extremity edema, unexplained weight gain, cough, hemoptysis or wheezing.  She has lost quite a bit of weight. Work hours limit exercise schedule, but she is eating a healthy diet. Stopped atorvastatin - could not remember to take it in the evening.  She has a chronic left bundle branch block without any documentation of high-grade AV block and without evidence of significant structural heart disease by echo.  She has mild mitral regurgitation.  There is some evidence of mild diastolic dysfunction and mild left atrial dilation.  Coronary CT angiography performed April 2018 showed a 50% tubular narrowing in the first diagonal artery and mild plaque in the RCA and mid LAD, no significant stenosis.  However her calcium score was 61st percentile for age and gender.     Past Medical History:  Diagnosis Date  . Fracture of left foot   . Fracture of left pelvis ([place])   . Heart murmur   . Hyperlipidemia   . Hypertension   . LBBB (left bundle branch block)    Echo in 2009 at [facility] showed mild MR and interventricular septal dyssynergy due to the LBBB.  Her EF was mildly  reduced to 50-55%    Past Surgical History:  Procedure Laterality Date  . COLONOSCOPY  07/2005   normal  . PIP JOINT FUSION Left    steel rod left foot   . ROTATOR CUFF REPAIR Bilateral     Outpatient Medications Prior to Visit  Medication Sig Dispense Refill  . amLODipine (NORVASC) 2.5 MG tablet Take by mouth.    . Cholecalciferol (VITAMIN D3) 2000 units TABS Take 1 tablet by mouth daily.    . fluticasone (FLONASE) 50 MCG/ACT nasal spray As needed    . ibuprofen (ADVIL) 800 MG tablet ibuprofen 800 mg tablet  Take 1 tablet every 8 hours by oral route PRN severe pain    . Magnesium 500 MG TABS Take 1 tablet by mouth daily.    . meclizine (ANTIVERT) 25 MG tablet Take by mouth.    . ondansetron (ZOFRAN) 4 MG tablet Take by mouth.    . ondansetron (ZOFRAN-ODT) 8 MG disintegrating tablet Take by mouth.    . Potassium Gluconate 595 MG CAPS Take 595 mg by mouth daily.    Marland Kitchen telmisartan (MICARDIS) 40 MG tablet TAKE 1 TABLET BY MOUTH DAILY 90 tablet 3  . atorvastatin (LIPITOR) 40 MG tablet TAKE 1 TABLET(40 MG) BY MOUTH DAILY 90 tablet 1   No facility-administered medications prior to visit.     Allergies:   Latex and Sulfa antibiotics   Social History   Socioeconomic History  . Marital status: Married    Spouse name: Not on file  .  Number of children: Not on file  . Years of education: Not on file  . Highest education level: Not on file  Occupational History  . Not on file  Tobacco Use  . Smoking status: Never Smoker  . Smokeless tobacco: Never Used  Substance and Sexual Activity  . Alcohol use: Yes    Alcohol/week: 0.0 standard drinks    Comment: drinks wine occasionally--3 times a week   . Drug use: No  . Sexual activity: Yes    Birth control/protection: Post-menopausal  Other Topics Concern  . 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   ROS:   Please see the history of present illness.    ROS All other systems reviewed and are negative.   PHYSICAL EXAM:   VS:  BP 126/80   Pulse 61   Ht 5\' 5"  (1.651 m)   Wt 154 lb 6.4 oz (70 kg)   BMI 25.69 kg/m      General: Alert, oriented x3, no distress, appears well Head: no evidence of trauma, PERRL, EOMI, no exophtalmos or lid lag, no myxedema, no xanthelasma; normal ears, nose and oropharynx Neck: normal jugular venous pulsations and no hepatojugular reflux; brisk carotid pulses without delay and no carotid bruits Chest: clear to auscultation, no signs of consolidation by percussion or palpation, normal fremitus, symmetrical and full respiratory excursions Cardiovascular: normal position and quality of the apical impulse, regular rhythm, normal first and second heart sounds, cannot hear an apical murmur today, no diastolic murmurs, rubs or gallops Abdomen: no tenderness or distention, no masses by palpation, no abnormal pulsatility or arterial bruits, normal bowel sounds, no hepatosplenomegaly Extremities: no clubbing, cyanosis or edema; 2+ radial, ulnar and brachial pulses bilaterally; 2+ right femoral, posterior tibial and dorsalis pedis pulses; 2+ left femoral, posterior tibial and dorsalis pedis pulses; no subclavian or femoral bruits Neurological: grossly nonfocal Psych: Normal  mood and affect   Wt Readings from Last 3 Encounters:  08/12/20 154 lb 6.4 oz (70 kg)  07/07/17 159 lb (72.1 kg)  11/01/16 160 lb 6.4 oz (72.8 kg)      Studies/Labs Reviewed:   EKG:  EKG is ordered today.  The ekg ordered today shows sinus rhythm and old LBBB Recent Labs: No results found for requested labs within last 8760 hours.   Lipid Panel    Component Value Date/Time   CHOL 258 (H) 08/12/2020 1003   TRIG 143 08/12/2020 1003   HDL 75 08/12/2020 1003   CHOLHDL 3.4 08/12/2020 1003   CHOLHDL 4.7 02/06/2013 0756   VLDL 20 02/06/2013 0756   LDLCALC 158 (H) 08/12/2020 1003  February 2017 total cholesterol 176, triglycerides 65, HDL 47, LDL 160 Do not yet have the lab results from this year  ASSESSMENT:    1. Coronary artery disease involving native coronary artery of native heart without angina pectoris   2. Essential hypertension, benign   3. LBBB (left bundle branch block)   4. Pure hypercholesterolemia      PLAN:  In order of problems listed above:  1. CAD: no angina despite being very active. The focus is on risk factor modification. 2. HTN: well controlled. 3. LBBB: Asymptomatic. Chronic abnormality without evidence of significant structural cardiac illness or coronary disease. No symptoms of high-grade AV block.   4. HLP: Ideally, would bring the LDL cholesterol down to less than 70 since there is some evidence of early coronary disease, but at least we will treat for LDL under 100.  Lipids checked today show LDL 143, restart atorvastatin, can take any time of day.  Medication Adjustments/Labs and Tests Ordered: Current medicines are reviewed at length with the patient today.  Concerns regarding medicines are outlined above.  Medication changes, Labs and Tests ordered today are listed in the Patient Instructions below. Patient Instructions  Medication Instructions:  No changes *If you need a refill on your cardiac medications before your next appointment,  please call your pharmacy*   Lab Work: Your provider would like for you to have the following labs today: Lipids  If you have labs (blood work) drawn today and your tests are completely normal, you will receive your results only by: Marland Kitchen MyChart Message (if you have MyChart) OR . A paper copy in the mail If you have any lab test that is abnormal or we need to change your treatment, we will call you to review the results.   Testing/Procedures: None ordered   Follow-Up: At [facility], you and your health needs are our priority.  As part of our continuing mission to provide you with exceptional heart care, we have created designated Provider Care Teams.  These Care Teams include your primary Cardiologist (physician) and Advanced Practice Providers (APPs -  Physician Assistants and Nurse Practitioners) who all work together to provide you with the care you need, when you need it.  We recommend signing up for the patient portal called "MyChart".  Sign up information is provided on this After Visit Summary.  MyChart is used to connect with patients for Virtual Visits (Telemedicine).  Patients are able to view lab/test results, encounter notes, upcoming appointments, etc.  Non-urgent messages can be sent to your provider as well.   To learn more about what you can do with MyChart, go to [email or web address].    Your next appointment:   12 month(s)  The format for your next appointment:   In Person  Provider:   Sanda Klein, MD         Signed, Sanda Klein, MD  08/12/2020 8:21 PM    [address], [place], [place]  [address] Phone: [phone]; Fax: [phone]

## 2021-03-10 ENCOUNTER — Encounter: Payer: Self-pay | Admitting: Orthopaedic Surgery

## 2021-03-10 ENCOUNTER — Ambulatory Visit (INDEPENDENT_AMBULATORY_CARE_PROVIDER_SITE_OTHER): Payer: Managed Care, Other (non HMO)

## 2021-03-10 ENCOUNTER — Other Ambulatory Visit: Payer: Self-pay

## 2021-03-10 ENCOUNTER — Ambulatory Visit (INDEPENDENT_AMBULATORY_CARE_PROVIDER_SITE_OTHER): Payer: Managed Care, Other (non HMO) | Admitting: Orthopaedic Surgery

## 2021-03-10 DIAGNOSIS — S92355A Nondisplaced fracture of fifth metatarsal bone, left foot, initial encounter for closed fracture: Secondary | ICD-10-CM | POA: Diagnosis not present

## 2021-03-10 DIAGNOSIS — M79672 Pain in left foot: Secondary | ICD-10-CM | POA: Diagnosis not present

## 2021-03-10 DIAGNOSIS — M25572 Pain in left ankle and joints of left foot: Secondary | ICD-10-CM | POA: Diagnosis not present

## 2021-03-10 LAB — VITAMIN D 25 HYDROXY (VIT D DEFICIENCY, FRACTURES): Vit D, 25-Hydroxy: 51 ng/mL (ref 30–100)

## 2021-03-10 LAB — EXTRA LAV TOP TUBE

## 2021-03-10 LAB — CALCIUM: Calcium: 10.1 mg/dL (ref 8.6–10.4)

## 2021-03-10 MED ORDER — CYCLOBENZAPRINE HCL 5 MG PO TABS: 5.0000 mg | ORAL_TABLET | Freq: Every evening | ORAL | 3 refills | Status: DC | PRN

## 2021-03-10 NOTE — Progress Notes (Signed)
Office Visit Note   Patient: Brenda Ayala           Date of Birth: Nov 18, 1955           MRN: [id number] Visit Date: 03/10/2021              Requested by: Jonathon Resides, MD [address] [place],  [place] [address] PCP: Jonathon Resides, MD   Assessment & Plan: Visit Diagnoses:  1. Nondisplaced fracture of fifth metatarsal bone, left foot, initial encounter for closed fracture   2. 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  In terms of the fifth metatarsal fracture we did discuss that there is a slightly higher risk of nonunion of around 15% however I do not necessarily recommend surgical treatment acutely as she does not have any identifiable risk factors for nonunion.  We will check a vitamin D and calcium level today to make sure that these are within normal limits.  Handicap placard provided today.  I provided her with a letter for the airport so that she can be transported by the cart while she is in the airport terminal.  She will avoid NSAIDs for risk of fracture nonunion and she will use Tylenol more frequently.  She does get occasional nighttime foot cramps and muscle spasms sent in a prescription for Flexeril in case she needs it.  Prescription for knee scooter was provided as well to allow her to be nonweightbearing.  I recommended 2 weeks of nonweightbearing and then progressive weightbearing based on her symptoms.  I would like to recheck her in 4 weeks with three-view x-rays of the left foot.  Follow-Up Instructions: Return in about 4 weeks (around 04/07/2021).   Orders:  Orders Placed This Encounter  Procedures   XR Ankle Complete Left   Vitamin D (25 hydroxy)   Calcium   Meds ordered this  encounter  Medications   cyclobenzaprine (FLEXERIL) 5 MG tablet    Sig: Take 1-2 tablets (5-10 mg total) by mouth at bedtime as needed for muscle spasms.    Dispense:  30 tablet    Refill:  3      Procedures: No procedures performed   Clinical Data: No additional findings.   Subjective: Chief Complaint  Patient presents with   Left Foot - Pain    Brenda Ayala is very pleasant 65 year old female mother of Dr. Roanna Banning of anesthesia who comes in for evaluation of acute onset of left foot pain from this weekend.  She was carrying her granddaughter while trying to sit [place] style and felt a pop in her left foot and had immediate pain and inability to weight-bear.  She went to the [facility] urgent care on Sunday and x-rays of the left foot showed a nondisplaced fifth metatarsal base fracture.  She was placed in a cam boot as well as a pair crutches.  She comes in today for further evaluation treatment.  Prior to this injury she was actually having left ankle pain and originally this appointment was made for evaluation of this but since her new left foot injury her ankle has stopped hurting.  She is status post left subtalar fusion in  1990 by Dr. Para March at [facility].  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   Review of Systems   Objective: Vital Signs: There were no vitals taken for this visit.  Physical Exam  Ortho Exam Examination of the left foot and ankle shows mild swelling.  No neurovascular compromise.  She has multiple fully healed surgical scars from prior subtalar fusion surgery.  Ankle range of motion does not produce any pain.  I do not feel ankle effusion.  She does have considerable tenderness to the base of the fifth metatarsal on the lateral and plantar surfaces.  Specialty Comments:  No specialty comments available.  Imaging: XR Ankle Complete Left  Result Date: 03/10/2021 Mild arthritic changes of the tibiotalar joint.   Stable hardware from prior subtalar fusion.    PMFS History: Patient Active Problem List   Diagnosis Date Noted   Nondisplaced fracture of fifth metatarsal bone, left foot, initial encounter for closed fracture 03/10/2021   Pain in left ankle and joints of left foot 03/10/2021   MR (mitral regurgitation) 07/09/2017   Chest pain with moderate risk of acute coronary syndrome 11/01/2016   Family history of coronary artery disease in mother 11/01/2016   LBBB (left bundle branch block) 10/02/2015   Murmur, cardiac 10/02/2015   Need for prophylactic vaccination and inoculation against influenza 06/11/2013   Benign paroxysmal positional vertigo 06/11/2013   Restless leg syndrome 06/11/2013   Pain in limb 06/11/2013   Hypercholesterolemia 04/22/2013   Essential hypertension, benign 04/22/2013   Dizziness and giddiness 11/18/2012   Urine ketones 11/18/2012   Abdominal pain, unspecified site 11/11/2012   Nausea with vomiting 11/11/2012   Past Medical History:  Diagnosis Date   Fracture of left foot    Fracture of left pelvis (HCC)    Heart murmur    Hyperlipidemia    Hypertension    LBBB (left bundle branch block)    Echo in 2009 at [facility] showed mild MR and interventricular septal dyssynergy due to the LBBB.  Her EF was mildly reduced to 50-55%    Family History  Problem Relation Age of Onset   Heart disease Mother    Colon cancer Neg Hx    Colon polyps Neg Hx    Esophageal cancer Neg Hx    Rectal cancer Neg Hx    Stomach cancer Neg Hx     Past Surgical History:  Procedure Laterality Date   COLONOSCOPY  07/2005   normal   PIP JOINT FUSION Left    steel rod left foot    ROTATOR CUFF REPAIR Bilateral    Social History   Occupational History   Not on file  Tobacco Use   Smoking status: Never   Smokeless tobacco: Never  Substance and Sexual Activity   Alcohol use: Yes    Alcohol/week: 0.0 standard drinks    Comment: drinks wine occasionally--3 times a  week    Drug use: No   Sexual activity: Yes    Birth control/protection: Post-menopausal

## 2021-03-11 DIAGNOSIS — J984 Other disorders of lung: Secondary | ICD-10-CM | POA: Insufficient documentation

## 2021-03-11 DIAGNOSIS — R0602 Shortness of breath: Secondary | ICD-10-CM | POA: Insufficient documentation

## 2021-03-11 DIAGNOSIS — U099 Post covid-19 condition, unspecified: Secondary | ICD-10-CM | POA: Insufficient documentation

## 2021-03-11 DIAGNOSIS — R053 Chronic cough: Secondary | ICD-10-CM | POA: Insufficient documentation

## 2021-04-07 ENCOUNTER — Ambulatory Visit: Payer: Managed Care, Other (non HMO) | Admitting: Orthopaedic Surgery

## 2021-04-09 ENCOUNTER — Ambulatory Visit (INDEPENDENT_AMBULATORY_CARE_PROVIDER_SITE_OTHER): Payer: Managed Care, Other (non HMO) | Admitting: Orthopaedic Surgery

## 2021-04-09 ENCOUNTER — Other Ambulatory Visit: Payer: Self-pay

## 2021-04-09 ENCOUNTER — Ambulatory Visit (INDEPENDENT_AMBULATORY_CARE_PROVIDER_SITE_OTHER): Payer: Managed Care, Other (non HMO)

## 2021-04-09 ENCOUNTER — Encounter: Payer: Self-pay | Admitting: Orthopaedic Surgery

## 2021-04-09 DIAGNOSIS — S92355A Nondisplaced fracture of fifth metatarsal bone, left foot, initial encounter for closed fracture: Secondary | ICD-10-CM

## 2021-04-09 NOTE — Progress Notes (Signed)
Patient: Brenda Ayala           Date of Birth: February 01, 1956           MRN: [id number] Visit Date: 04/09/2021 PCP: Jonathon Resides, MD   Assessment & Plan:  Chief Complaint:  Chief Complaint  Patient presents with   Left Foot - Pain   Visit Diagnoses:  1. Nondisplaced fracture of fifth metatarsal bone, left foot, initial encounter for closed fracture     Plan: [name] returns today 4 weeks status post left fifth metatarsal fracture.  Overall doing better.  She has been weightbearing in the cam boot without any pain.  She did try wearing Hoka running shoes this morning and felt pain therefore she went right back into the cam boot.  She does not feel like she needs crutches.  Occasionally some numbness and tingling but in the last couple weeks she has noticed significant improvement in her symptoms.  Left foot does not show any significant swelling.  No neurovascular compromise.  No significant bony tenderness to light touch.  Her x-rays demonstrate partial healing of the fracture which is very good in my opinion given the nature of the fracture and that he has only been 4 weeks.  I recommend wearing the boot for another 2 weeks and then she can try to wean into a postop shoe as tolerated which we provided today.  We will need to recheck her in 4weeks for repeat three-view x-rays of the left foot.  Follow-Up Instructions: Return in about 4 weeks (around 05/07/2021).   Orders:  Orders Placed This Encounter  Procedures   XR Foot Complete Left   No orders of the defined types were placed in this encounter.   Imaging: XR Foot Complete Left  Result Date: 04/09/2021 Partially healed fifth metatarsal fracture.  No bony resorption.   PMFS History: Patient Active Problem List   Diagnosis Date Noted   Shortness of breath 03/11/2021   Restrictive lung disease 03/11/2021   Post covid-19 condition, unspecified 03/11/2021   Chronic cough 03/11/2021   Nondisplaced fracture of fifth metatarsal  bone, left foot, initial encounter for closed fracture 03/10/2021   Pain in left ankle and joints of left foot 03/10/2021   Primary osteoarthritis of left foot 05/29/2019   Stress fracture of metatarsal bone of left foot 05/04/2018   Primary osteoarthritis, right ankle and foot 02/17/2018   Peroneal tendinitis of lower leg, left 02/17/2018   MR (mitral regurgitation) 07/09/2017   Chest pain with moderate risk of acute coronary syndrome 11/01/2016   Family history of coronary artery disease in mother 11/01/2016   Trochanteric bursitis of left hip 01/27/2016   LBBB (left bundle branch block) 10/02/2015   Murmur, cardiac 10/02/2015   Hip bursitis 05/31/2014   Need for prophylactic vaccination and inoculation against influenza 06/11/2013   Benign paroxysmal positional vertigo 06/11/2013   Restless leg syndrome 06/11/2013   Pain in limb 06/11/2013   Hypercholesterolemia 04/22/2013   Essential hypertension, benign 04/22/2013   Dizziness and giddiness 11/18/2012   Urine ketones 11/18/2012   Abdominal pain, unspecified site 11/11/2012   Nausea with vomiting 11/11/2012   Past Medical History:  Diagnosis Date   Fracture of left foot    Fracture of left pelvis (HCC)    Heart murmur    Hyperlipidemia    Hypertension    LBBB (left bundle branch block)    Echo in 2009 at [facility] showed mild MR and interventricular septal dyssynergy due to the  LBBB.  Her EF was mildly reduced to 50-55%    Family History  Problem Relation Age of Onset   Heart disease Mother    Colon cancer Neg Hx    Colon polyps Neg Hx    Esophageal cancer Neg Hx    Rectal cancer Neg Hx    Stomach cancer Neg Hx     Past Surgical History:  Procedure Laterality Date   COLONOSCOPY  07/2005   normal   PIP JOINT FUSION Left    steel rod left foot    ROTATOR CUFF REPAIR Bilateral    Social History   Occupational History   Not on file  Tobacco Use   Smoking status: Never   Smokeless tobacco: Never   Substance and Sexual Activity   Alcohol use: Yes    Alcohol/week: 0.0 standard drinks    Comment: drinks wine occasionally--3 times a week    Drug use: No   Sexual activity: Yes    Birth control/protection: Post-menopausal

## 2021-05-12 ENCOUNTER — Ambulatory Visit (INDEPENDENT_AMBULATORY_CARE_PROVIDER_SITE_OTHER): Payer: Managed Care, Other (non HMO) | Admitting: Orthopaedic Surgery

## 2021-05-12 ENCOUNTER — Encounter: Payer: Self-pay | Admitting: Orthopaedic Surgery

## 2021-05-12 ENCOUNTER — Other Ambulatory Visit: Payer: Self-pay

## 2021-05-12 ENCOUNTER — Ambulatory Visit: Payer: Managed Care, Other (non HMO)

## 2021-05-12 VITALS — Wt 154.0 lb

## 2021-05-12 DIAGNOSIS — S92355A Nondisplaced fracture of fifth metatarsal bone, left foot, initial encounter for closed fracture: Secondary | ICD-10-CM | POA: Diagnosis not present

## 2021-05-12 NOTE — Progress Notes (Signed)
Office Visit Note   Patient: Brenda Ayala           Date of Birth: 1956-05-04           MRN: [id number] Visit Date: 05/12/2021              Requested by: Jonathon Resides, MD [address] [place],  [place] [address] PCP: Jonathon Resides, MD   Assessment & Plan: Visit Diagnoses:  1. Nondisplaced fracture of fifth metatarsal bone, left foot, initial encounter for closed fracture     Plan: [name] is now a little over 9 weeks status post left fifth metatarsal base fracture.  Pain has improved but still persistent when she weightbears without the postop shoe.  Takes occasional Tylenol.  No real complaints otherwise.  The left foot shows no swelling.  Slight tenderness to the fracture site on the dorsal aspect of the fifth metatarsal base.  Her x-rays demonstrate that the fracture is consolidating and healing in comparison to the prior x-rays.  Since she is still having pain when she does not wear the postop shoe I recommended that she continue this for at least another 4weeks at which point we will have her come back for repeat three-view x-rays of the left foot.  She continues to take vitamin D and calcium supplements.  May need to consider bone stimulator based on repeat images in 4 weeks.  Follow-Up Instructions: Return in about 4 weeks (around 06/09/2021).   Orders:  Orders Placed This Encounter  Procedures   XR Foot Complete Left   No orders of the defined types were placed in this encounter.     Procedures: No procedures performed   Clinical Data: No additional findings.   Subjective: Chief Complaint  Patient presents with   Left Foot - Follow-up    Left fifth metatarsal fracture    HPI  Review of Systems   Objective: Vital Signs: Wt 154 lb (69.9 kg)   BMI 25.63 kg/m   Physical Exam  Ortho Exam  Specialty Comments:  No specialty comments available.  Imaging: XR Foot Complete Left  Result Date: 05/12/2021 Slight fracture lucency near the  lateral cortex.  Overall the fracture is demonstrating consolidation and healing.  There is no bony resorption.    PMFS History: Patient Active Problem List   Diagnosis Date Noted   Shortness of breath 03/11/2021   Restrictive lung disease 03/11/2021   Post covid-19 condition, unspecified 03/11/2021   Chronic cough 03/11/2021   Nondisplaced fracture of fifth metatarsal bone, left foot, initial encounter for closed fracture 03/10/2021   Pain in left ankle and joints of left foot 03/10/2021   Primary osteoarthritis of left foot 05/29/2019   Stress fracture of metatarsal bone of left foot 05/04/2018   Primary osteoarthritis, right ankle and foot 02/17/2018   Peroneal tendinitis of lower leg, left 02/17/2018   MR (mitral regurgitation) 07/09/2017   Chest pain with moderate risk of acute coronary syndrome 11/01/2016   Family history of coronary artery disease in mother 11/01/2016   Trochanteric bursitis of left hip 01/27/2016   LBBB (left bundle branch block) 10/02/2015   Murmur, cardiac 10/02/2015   Hip bursitis 05/31/2014   Need for prophylactic vaccination and inoculation against influenza 06/11/2013   Benign paroxysmal positional vertigo 06/11/2013   Restless leg syndrome 06/11/2013   Pain in limb 06/11/2013   Hypercholesterolemia 04/22/2013   Essential hypertension, benign 04/22/2013   Dizziness and giddiness 11/18/2012   Urine ketones 11/18/2012  Abdominal pain, unspecified site 11/11/2012   Nausea with vomiting 11/11/2012   Past Medical History:  Diagnosis Date   Fracture of left foot    Fracture of left pelvis (HCC)    Heart murmur    Hyperlipidemia    Hypertension    LBBB (left bundle branch block)    Echo in 2009 at [facility] showed mild MR and interventricular septal dyssynergy due to the LBBB.  Her EF was mildly reduced to 50-55%    Family History  Problem Relation Age of Onset   Heart disease Mother    Colon cancer Neg Hx    Colon polyps Neg Hx     Esophageal cancer Neg Hx    Rectal cancer Neg Hx    Stomach cancer Neg Hx     Past Surgical History:  Procedure Laterality Date   COLONOSCOPY  07/2005   normal   PIP JOINT FUSION Left    steel rod left foot    ROTATOR CUFF REPAIR Bilateral    Social History   Occupational History   Not on file  Tobacco Use   Smoking status: Never   Smokeless tobacco: Never  Substance and Sexual Activity   Alcohol use: Yes    Alcohol/week: 0.0 standard drinks    Comment: drinks wine occasionally--3 times a week    Drug use: No   Sexual activity: Yes    Birth control/protection: Post-menopausal

## 2021-06-04 ENCOUNTER — Other Ambulatory Visit: Payer: Self-pay | Admitting: Orthopaedic Surgery

## 2021-06-09 ENCOUNTER — Encounter: Payer: Self-pay | Admitting: Orthopaedic Surgery

## 2021-06-09 ENCOUNTER — Other Ambulatory Visit: Payer: Self-pay

## 2021-06-09 ENCOUNTER — Ambulatory Visit: Payer: Self-pay

## 2021-06-09 ENCOUNTER — Ambulatory Visit (INDEPENDENT_AMBULATORY_CARE_PROVIDER_SITE_OTHER): Payer: Managed Care, Other (non HMO) | Admitting: Orthopaedic Surgery

## 2021-06-09 DIAGNOSIS — S92355A Nondisplaced fracture of fifth metatarsal bone, left foot, initial encounter for closed fracture: Secondary | ICD-10-CM | POA: Diagnosis not present

## 2021-06-09 NOTE — Progress Notes (Signed)
Patient: Brenda Ayala           Date of Birth: 1956/05/30           MRN: [id number] Visit Date: 06/09/2021 PCP: Jonathon Resides, MD   Assessment & Plan:  Chief Complaint:  Chief Complaint  Patient presents with   Left Foot - Pain, Follow-up   Visit Diagnoses:  1. Nondisplaced fracture of fifth metatarsal bone, left foot, initial encounter for closed fracture     Plan: [name] returns today approximately 12 weeks status post left Jones fracture.  Clinically speaking she is improving and has less discomfort and pain and she is able to wear Birkenstocks at home.  She really has discomfort with increased standing and walking.  The left foot shows no swelling or bony tenderness or crepitus or motion across the fracture site.  She has some slight discomfort with barefoot weightbearing.  The x-rays show consolidation to the medial half of the fracture but persistent fracture lucency and gapping to the lateral aspect of the fracture.  Based on these findings and persistent fracture lucency that this is consistent with delayed union therefore she will need a bone stimulator which we will order at this time.  Clinically speaking she is improving therefore do not feel that we need to consider surgical treatment at this time.  She will continue to use the postop shoe for prolonged standing or walking and Hoka's or Birkenstocks otherwise.  We will see her back in 6 weeks with repeat three-view x-rays of the left foot.  Follow-Up Instructions: Return in about 6 weeks (around 07/21/2021).   Orders:  Orders Placed This Encounter  Procedures   XR Foot Complete Left   No orders of the defined types were placed in this encounter.   Imaging: XR Foot Complete Left  Result Date: 06/09/2021 Persistent fracture lucency on the lateral aspect of the fracture.  The medial aspect of the fracture has fully consolidated.   PMFS History: Patient Active Problem List   Diagnosis Date Noted   Shortness of  breath 03/11/2021   Restrictive lung disease 03/11/2021   Post covid-19 condition, unspecified 03/11/2021   Chronic cough 03/11/2021   Nondisplaced fracture of fifth metatarsal bone, left foot, initial encounter for closed fracture 03/10/2021   Pain in left ankle and joints of left foot 03/10/2021   Primary osteoarthritis of left foot 05/29/2019   Stress fracture of metatarsal bone of left foot 05/04/2018   Primary osteoarthritis, right ankle and foot 02/17/2018   Peroneal tendinitis of lower leg, left 02/17/2018   MR (mitral regurgitation) 07/09/2017   Chest pain with moderate risk of acute coronary syndrome 11/01/2016   Family history of coronary artery disease in mother 11/01/2016   Trochanteric bursitis of left hip 01/27/2016   LBBB (left bundle branch block) 10/02/2015   Murmur, cardiac 10/02/2015   Hip bursitis 05/31/2014   Need for prophylactic vaccination and inoculation against influenza 06/11/2013   Benign paroxysmal positional vertigo 06/11/2013   Restless leg syndrome 06/11/2013   Pain in limb 06/11/2013   Hypercholesterolemia 04/22/2013   Essential hypertension, benign 04/22/2013   Dizziness and giddiness 11/18/2012   Urine ketones 11/18/2012   Abdominal pain, unspecified site 11/11/2012   Nausea with vomiting 11/11/2012   Past Medical History:  Diagnosis Date   Fracture of left foot    Fracture of left pelvis (HCC)    Heart murmur    Hyperlipidemia    Hypertension    LBBB (left bundle branch block)  Echo in 2009 at [facility] showed mild MR and interventricular septal dyssynergy due to the LBBB.  Her EF was mildly reduced to 50-55%    Family History  Problem Relation Age of Onset   Heart disease Mother    Colon cancer Neg Hx    Colon polyps Neg Hx    Esophageal cancer Neg Hx    Rectal cancer Neg Hx    Stomach cancer Neg Hx     Past Surgical History:  Procedure Laterality Date   COLONOSCOPY  07/2005   normal   PIP JOINT FUSION Left    steel  rod left foot    ROTATOR CUFF REPAIR Bilateral    Social History   Occupational History   Not on file  Tobacco Use   Smoking status: Never   Smokeless tobacco: Never  Substance and Sexual Activity   Alcohol use: Yes    Alcohol/week: 0.0 standard drinks    Comment: drinks wine occasionally--3 times a week    Drug use: No   Sexual activity: Yes    Birth control/protection: Post-menopausal

## 2021-07-21 ENCOUNTER — Ambulatory Visit (INDEPENDENT_AMBULATORY_CARE_PROVIDER_SITE_OTHER): Payer: Managed Care, Other (non HMO) | Admitting: Orthopaedic Surgery

## 2021-07-21 ENCOUNTER — Encounter: Payer: Self-pay | Admitting: Orthopaedic Surgery

## 2021-07-21 ENCOUNTER — Other Ambulatory Visit: Payer: Self-pay

## 2021-07-21 ENCOUNTER — Ambulatory Visit (INDEPENDENT_AMBULATORY_CARE_PROVIDER_SITE_OTHER): Payer: Managed Care, Other (non HMO)

## 2021-07-21 DIAGNOSIS — S92355A Nondisplaced fracture of fifth metatarsal bone, left foot, initial encounter for closed fracture: Secondary | ICD-10-CM

## 2021-07-21 DIAGNOSIS — M25572 Pain in left ankle and joints of left foot: Secondary | ICD-10-CM | POA: Diagnosis not present

## 2021-07-21 NOTE — Progress Notes (Signed)
Office Visit Note   Patient: Brenda Ayala           Date of Birth: 1955-10-23           MRN: [id number] Visit Date: 07/21/2021              Requested by: Jonathon Resides, MD [address] [place],  [place] [address] PCP: Jonathon Resides, MD   Assessment & Plan: Visit Diagnoses:  1. Nondisplaced fracture of fifth metatarsal bone, left foot, initial encounter for closed fracture   2. Pain in left ankle and joints of left foot     Plan: [name] returns today for follow-up of left fifth metatarsal fracture.  She is still having pain when she has been walking or weightbearing or standing for a while.  No pain at rest.  She has been using the bone stimulator twice a day.  She continues to walk with a postop shoe.  Left foot exam is unchanged.  There are some slight tenderness to palpation at the fracture site.  There is no swelling.  The x-rays show that there is minimal improvement to the fracture healing.  Given these findings we need to obtain a CT scan to better characterize fracture healing and possible nonunion.  I will be in touch with her after the CT scan.  Follow-Up Instructions: No follow-ups on file.   Orders:  Orders Placed This Encounter  Procedures   XR Foot Complete Left   No orders of the defined types were placed in this encounter.     Procedures: No procedures performed   Clinical Data: No additional findings.   Subjective: Chief Complaint  Patient presents with   Left Foot - Pain    HPI  Review of Systems   Objective: Vital Signs: There were no vitals taken for this visit.  Physical Exam  Ortho Exam  Specialty Comments:  No specialty comments available.  Imaging: XR Foot Complete Left  Result Date: 07/21/2021 Minimal improvement in fifth metatarsal fracture.    PMFS History: Patient Active Problem List   Diagnosis Date Noted   Shortness of breath 03/11/2021   Restrictive lung disease 03/11/2021   Post covid-19 condition,  unspecified 03/11/2021   Chronic cough 03/11/2021   Nondisplaced fracture of fifth metatarsal bone, left foot, initial encounter for closed fracture 03/10/2021   Pain in left ankle and joints of left foot 03/10/2021   Primary osteoarthritis of left foot 05/29/2019   Stress fracture of metatarsal bone of left foot 05/04/2018   Primary osteoarthritis, right ankle and foot 02/17/2018   Peroneal tendinitis of lower leg, left 02/17/2018   MR (mitral regurgitation) 07/09/2017   Chest pain with moderate risk of acute coronary syndrome 11/01/2016   Family history of coronary artery disease in mother 11/01/2016   Trochanteric bursitis of left hip 01/27/2016   LBBB (left bundle branch block) 10/02/2015   Murmur, cardiac 10/02/2015   Hip bursitis 05/31/2014   Need for prophylactic vaccination and inoculation against influenza 06/11/2013   Benign paroxysmal positional vertigo 06/11/2013   Restless leg syndrome 06/11/2013   Pain in limb 06/11/2013   Hypercholesterolemia 04/22/2013   Essential hypertension, benign 04/22/2013   Dizziness and giddiness 11/18/2012   Urine ketones 11/18/2012   Abdominal pain, unspecified site 11/11/2012   Nausea with vomiting 11/11/2012   Past Medical History:  Diagnosis Date   Fracture of left foot    Fracture of left pelvis (HCC)    Heart murmur  Hyperlipidemia    Hypertension    LBBB (left bundle branch block)    Echo in 2009 at [facility] showed mild MR and interventricular septal dyssynergy due to the LBBB.  Her EF was mildly reduced to 50-55%    Family History  Problem Relation Age of Onset   Heart disease Mother    Colon cancer Neg Hx    Colon polyps Neg Hx    Esophageal cancer Neg Hx    Rectal cancer Neg Hx    Stomach cancer Neg Hx     Past Surgical History:  Procedure Laterality Date   COLONOSCOPY  07/2005   normal   PIP JOINT FUSION Left    steel rod left foot    ROTATOR CUFF REPAIR Bilateral    Social History   Occupational  History   Not on file  Tobacco Use   Smoking status: Never   Smokeless tobacco: Never  Substance and Sexual Activity   Alcohol use: Yes    Alcohol/week: 0.0 standard drinks    Comment: drinks wine occasionally--3 times a week    Drug use: No   Sexual activity: Yes    Birth control/protection: Post-menopausal

## 2021-07-21 NOTE — Addendum Note (Signed)
Addended by: Precious Bard on: 07/21/2021 03:30 PM   Modules accepted: Orders

## 2021-07-28 ENCOUNTER — Ambulatory Visit
Admission: RE | Admit: 2021-07-28 | Discharge: 2021-07-28 | Disposition: A | Discharge status: Home / Self Care | Payer: Managed Care, Other (non HMO) | Source: Ambulatory Visit | Attending: Orthopaedic Surgery | Admitting: Orthopaedic Surgery

## 2021-07-28 DIAGNOSIS — M25572 Pain in left ankle and joints of left foot: Secondary | ICD-10-CM

## 2021-07-28 DIAGNOSIS — S92355A Nondisplaced fracture of fifth metatarsal bone, left foot, initial encounter for closed fracture: Secondary | ICD-10-CM

## 2021-08-02 DIAGNOSIS — U071 COVID-19: Secondary | ICD-10-CM

## 2021-08-02 HISTORY — DX: COVID-19: U07.1

## 2021-08-07 ENCOUNTER — Other Ambulatory Visit: Payer: Self-pay | Admitting: Physician Assistant

## 2021-08-11 NOTE — Progress Notes (Deleted)
Cardiology Office Note:    Date:  08/11/2021   ID:  Brenda Ayala, DOB July 11, 1956, MRN [id number]  PCP:  Jonathon Resides, MD [place] Cardiologist: Sanda Klein, MD   Reason for visit: 1 year follow-up  History of Present Illness:    Brenda Ayala is a 66 y.o. female with a hx of HTN , LBBB, nonobstructive CAD, hypercholesterolemia and mild MR.  Coronary CT angiography performed April 2018 showed a 50% tubular narrowing in the first diagonal artery and mild plaque in the RCA and mid LAD, no significant stenosis. Her calcium score was 61st percentile for age and gender.    Saw Dr. Sallyanne Kuster in January 2022.  She was feeling well after weight loss.  Patient's LDL was 143.  She had stopped her Lipitor because she could not remember to take it at night.  Dr. Sallyanne Kuster restarted her Lipitor.  Today, ***  Coronary artery disease -***  Hypertension -*** -Goal BP is <130/80.  Recommend DASH diet (high in vegetables, fruits, low-fat dairy products, whole grains, poultry, fish, and nuts and low in sweets, sugar-sweetened beverages, and red meats), salt restriction and increase physical activity.  Hyperlipidemia -*** -Discussed cholesterol lowering diets - Mediterranean diet, DASH diet, vegetarian diet, low-carbohydrate diet and avoidance of trans fats.  Discussed healthier choice substitutes.  Nuts, high-fiber foods, and fiber supplements may also improve lipids.    Obesity -Discussed how even a 5-10% weight loss can have cardiovascular benefits.   -Recommend moderate intensity activity for 30 minutes 5 days/week and the DASH diet.  Disposition - Follow-up in ***     Past Medical History:  Diagnosis Date   Fracture of left foot    Fracture of left pelvis (HCC)    Heart murmur    Hyperlipidemia    Hypertension    LBBB (left bundle branch block)    Echo in 2009 at [facility] showed mild MR and interventricular septal dyssynergy due to the LBBB.  Her EF was mildly  reduced to 50-55%    Past Surgical History:  Procedure Laterality Date   COLONOSCOPY  07/2005   normal   PIP JOINT FUSION Left    steel rod left foot    ROTATOR CUFF REPAIR Bilateral     Current Medications: No outpatient medications have been marked as taking for the 08/13/21 encounter (Appointment) with Warren Lacy, PA-C.     Allergies:   Latex and Sulfa antibiotics   Social History   Socioeconomic History   Marital status: Married    Spouse name: Not on file   Number of children: Not on file   Years of education: Not on file   Highest education level: Not on file  Occupational History   Not on file  Tobacco Use   Smoking status: Never   Smokeless tobacco: Never  Substance and Sexual Activity   Alcohol use: Yes    Alcohol/week: 0.0 standard drinks    Comment: drinks wine occasionally--3 times a week    Drug use: No   Sexual activity: Yes    Birth control/protection: Post-menopausal  Other Topics Concern   Not on file  Social History Narrative   Marital Status: Married Copy)   Children: Sons (2); Daughter (1)     Pets: None   Living Situation: Lives with husband    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 There is no history of Colon cancer, Colon polyps, Esophageal cancer, Rectal cancer, or Stomach cancer.  ROS:   Please see the history of present illness.     EKGs/Labs/Other Studies Reviewed:     EKG:  The ekg ordered today demonstrates ***  Recent Labs: No results found for requested labs within last 8760 hours.   Recent Lipid Panel Lab Results  Component Value Date/Time   CHOL 258 (H) 08/12/2020 10:03 AM   TRIG 143 08/12/2020 10:03 AM   HDL 75 08/12/2020 10:03 AM   LDLCALC 158 (H) 08/12/2020 10:03 AM    Physical Exam:    VS:  There were no vitals taken for this visit.   No data found.  Wt Readings from Last 3 Encounters:  05/12/21 154 lb (69.9 kg)  08/12/20 154 lb 6.4 oz (70 kg)  07/07/17 159 lb (72.1 kg)     GEN: *** Well nourished, well developed in no acute distress HEENT: Normal NECK: No JVD; No carotid bruits CARDIAC: ***RRR, no murmurs, rubs, gallops RESPIRATORY:  Clear to auscultation without rales, wheezing or rhonchi  ABDOMEN: Soft, non-tender, non-distended MUSCULOSKELETAL: No edema; No deformity  SKIN: Warm and dry NEUROLOGIC:  Alert and oriented PSYCHIATRIC:  Normal affect     ASSESSMENT AND PLAN   ***   {Are you ordering a CV Procedure (e.g. stress test, cath, DCCV, TEE, etc)?   Press F2        :[id number]}    Medication Adjustments/Labs and Tests Ordered: Current medicines are reviewed at length with the patient today.  Concerns regarding medicines are outlined above.  No orders of the defined types were placed in this encounter.  No orders of the defined types were placed in this encounter.   There are no Patient Instructions on file for this visit.   Signed, Warren Lacy, PA-C  08/11/2021 9:54 PM    [facility]

## 2021-08-13 ENCOUNTER — Ambulatory Visit: Payer: Managed Care, Other (non HMO) | Admitting: Physician Assistant

## 2021-08-13 DIAGNOSIS — I10 Essential (primary) hypertension: Secondary | ICD-10-CM

## 2021-08-13 DIAGNOSIS — I2510 Atherosclerotic heart disease of native coronary artery without angina pectoris: Secondary | ICD-10-CM

## 2021-08-13 DIAGNOSIS — E7800 Pure hypercholesterolemia, unspecified: Secondary | ICD-10-CM

## 2021-08-24 MED ORDER — ATORVASTATIN CALCIUM 40 MG PO TABS: ORAL_TABLET | ORAL | 3 refills | Status: DC

## 2021-08-24 NOTE — Addendum Note (Signed)
Addended by: Beatrix Fetters on: 08/24/2021 02:17 PM   Modules accepted: Orders

## 2021-08-29 ENCOUNTER — Other Ambulatory Visit: Payer: Self-pay | Admitting: Orthopaedic Surgery

## 2021-08-29 MED ORDER — CYCLOBENZAPRINE HCL 5 MG PO TABS: 5.0000 mg | ORAL_TABLET | Freq: Every evening | ORAL | 3 refills | Status: DC | PRN

## 2021-09-30 NOTE — Progress Notes (Signed)
Cardiology Office Note:    Date:  10/01/2021   ID:  Brenda Ayala, DOB [birth year], MRN [id number]  PCP:  Brenda Resides, MD [place] Cardiologist: Brenda Klein, MD   Reason for visit: 1 year follow-up  History of Present Illness:    Brenda Ayala is a 66 y.o. female with a hx of HTN , LBBB, nonobstructive CAD, hypercholesterolemia and mild MR.  Coronary CT angiography performed April 2018 showed a 50% tubular narrowing in the first diagonal artery and mild plaque in the RCA and mid LAD, no significant stenosis. Her calcium score was 61st percentile for age and gender.   Saw Dr. Sallyanne Kuster in January 2022.  She was feeling well after weight loss.  Patient's LDL was 143.  She had stopped her Lipitor because she could not remember to take it at night.  Dr. Sallyanne Kuster restarted her Lipitor.  Today, she states she still has residual shortness of breath after COVID and pneumonia in June.  She is on an inhaler and is following up with pulmonologist.  Since COVID, she notices a very brief heart racing/flutter that happens every 3 weeks that lasts seconds.  This is decreasing with time.  Otherwise, she denies chest pain, PND, orthopnea and lower extremity edema.  Her blood pressure is elevated today though she says usually it is normal so she has not been checking it.  She notes an 11 pound weight gain since October.  She broke her ankle back in August and has not been eating as well or as active.  Typically at work, she is doing 12,000-14 steps a day working at [employer].  She has recently started weight watchers program.    Past Medical History:  Diagnosis Date   Fracture of left foot    Fracture of left pelvis (HCC)    Heart murmur    Hyperlipidemia    Hypertension    LBBB (left bundle branch block)    Echo in 2009 at [facility] showed mild MR and interventricular septal dyssynergy due to the LBBB.  Her EF was mildly reduced to 50-55%    Past Surgical History:   Procedure Laterality Date   COLONOSCOPY  07/2005   normal   PIP JOINT FUSION Left    steel rod left foot    ROTATOR CUFF REPAIR Bilateral     Current Medications: Current Meds  Medication Sig   [DISCONTINUED] Cholecalciferol (VITAMIN D3) 2000 units TABS Take 1 tablet by mouth daily.     Allergies:   Latex and Sulfa antibiotics   Social History   Socioeconomic History   Marital status: Married    Spouse name: Not on file   Number of children: Not on file   Years of education: Not on file   Highest education level: Not on file  Occupational History   Not on file  Tobacco Use   Smoking status: Never   Smokeless tobacco: Never  Substance and Sexual Activity   Alcohol use: Yes    Alcohol/week: 0.0 standard drinks    Comment: drinks wine occasionally--3 times a week    Drug use: No   Sexual activity: Yes    Birth control/protection: Post-menopausal  Other Topics Concern   Not on file  Social History Narrative   Marital Status: Married Copy)   Children: Sons (2); Daughter (1)     Pets: None   Living Situation: Lives with husband    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 There is no history of Colon cancer, Colon polyps, Esophageal cancer, Rectal cancer, or Stomach cancer.  ROS:   Please see the history of present illness.     EKGs/Labs/Other Studies Reviewed:    EKG:  The ekg ordered today demonstrates normal  sinus rhythm, left axis deviation, left bundle branch block, no change from prior.  Heart rate 62.  Recent Labs: No results found for requested labs within last 8760 hours.   Recent Lipid Panel Lab Results  Component Value Date/Time   CHOL 258 (H) 08/12/2020 10:03 AM   TRIG 143 08/12/2020 10:03 AM   HDL 75 08/12/2020 10:03 AM   LDLCALC 158 (H) 08/12/2020 10:03 AM    Physical Exam:    VS:  BP (!) 168/90    Pulse 62    Ht 5\' 4"  (1.626 m)    Wt 165 lb 9.6 oz (75.1 kg)    SpO2 97%    BMI 28.43 kg/m    No data found.  Wt Readings from Last 3 Encounters:  10/01/21 165 lb 9.6 oz (75.1 kg)  05/12/21 154 lb (69.9 kg)  08/12/20 154 lb 6.4 oz (70 kg)     GEN:  Well nourished, well developed in no acute distress HEENT: Normal NECK: No JVD; No carotid bruits CARDIAC: RRR, no murmurs, rubs, gallops RESPIRATORY:  Clear to auscultation without rales, wheezing or rhonchi  ABDOMEN: Soft, non-tender, non-distended MUSCULOSKELETAL: No edema; No deformity  SKIN: Warm and dry NEUROLOGIC:  Alert and oriented PSYCHIATRIC:  Normal affect     ASSESSMENT AND PLAN   Coronary artery disease with no angina -Coronary CT angiography April 2018 showed a 50% tubular narrowing in the first diagonal artery and mild plaque in the RCA and mid LAD, no significant stenosis. Her calcium score was 61st percentile for age and gender.  -Continue statin therapy. -Focus on risk factor control, particularly blood pressure control.  Hypertension, BP elevated today -BP has been normal on prior visits.  Recommend 2-week blood pressure log.  In the meanwhile, continue amlodipine and Micardis.  She had a normal creatinine and potassium in November 2022. -Goal BP is <130/80.  Recommend DASH diet (high in vegetables, fruits, low-fat dairy products, whole grains, poultry, fish, and nuts and low in sweets, sugar-sweetened beverages, and red meats), salt restriction and increase physical activity.  Hyperlipidemia -LDL had  improved from 158 in 08/2020 to 81 in 06/2021 after taking Lipitor regularly.  I think she can reach goal of LDL less than 70 with weight loss.  Continue Lipitor 40 mg daily. -Discussed cholesterol lowering diets - Mediterranean diet, DASH diet, vegetarian diet, low-carbohydrate diet and avoidance of trans fats.  Discussed healthier choice substitutes.  Nuts, high-fiber foods, and fiber supplements may also improve lipids.    Palpitations -Brief and infrequent.  Improving post COVID. -Asked her to monitor.  Consider getting a  smart watch.  If increase in frequency, we will consider doing a 2 week Zio patch.  She does have a latex allergy so we need to be careful about adhesives.    Disposition - Follow-up in 1 year with Dr. Sallyanne Kuster.        Medication Adjustments/Labs and Tests Ordered: Current medicines are reviewed at length with the patient today.  Concerns regarding medicines are outlined above.  Orders Placed This Encounter  Procedures   EKG 12-Lead   No orders of the defined types were placed in this encounter.   Patient Instructions  Medication Instructions:  No changes *If you need a refill on your cardiac medications before your next appointment, please call your pharmacy*   Lab Work: No Labs If you have labs (blood work) drawn today and your tests are completely normal, you will receive your results only by: [place] (if you have MyChart) OR A paper copy in the mail If you have any lab test that is abnormal or we need to change your treatment, we will call you to review the results.   Testing/Procedures: No Testing   Follow-Up: At [facility], you and your health needs are our priority.  As part of our continuing mission to provide you with exceptional heart care, we have created designated Provider Care Teams.  These Care Teams include your primary Cardiologist (physician) and Advanced Practice Providers (APPs -  Physician Assistants and Nurse  Practitioners) who all work together to provide you with the care you need, when you need it.  We recommend signing up for the patient portal called "MyChart".  Sign up information is provided on this After Visit Summary.  MyChart is used to connect with patients for Virtual Visits (Telemedicine).  Patients are able to view lab/test results, encounter notes, upcoming appointments, etc.  Non-urgent messages can be sent to your provider as well.   To learn more about what you can do with MyChart, go to [email or web address].    Your next appointment:   1 year(s)  The format for your next appointment:   In Person  Provider:   Sanda Klein, MD        Signed, Warren Lacy, PA-C  10/01/2021 8:51 AM    [place]

## 2021-10-01 ENCOUNTER — Ambulatory Visit (INDEPENDENT_AMBULATORY_CARE_PROVIDER_SITE_OTHER): Payer: Managed Care, Other (non HMO) | Admitting: Physician Assistant

## 2021-10-01 ENCOUNTER — Encounter: Payer: Self-pay | Admitting: Physician Assistant

## 2021-10-01 ENCOUNTER — Other Ambulatory Visit: Payer: Self-pay

## 2021-10-01 VITALS — BP 168/90 | HR 62 | Ht 64.0 in | Wt 165.6 lb

## 2021-10-01 DIAGNOSIS — I2510 Atherosclerotic heart disease of native coronary artery without angina pectoris: Secondary | ICD-10-CM

## 2021-10-01 DIAGNOSIS — I10 Essential (primary) hypertension: Secondary | ICD-10-CM | POA: Diagnosis not present

## 2021-10-01 DIAGNOSIS — R002 Palpitations: Secondary | ICD-10-CM | POA: Diagnosis not present

## 2021-10-01 DIAGNOSIS — E7800 Pure hypercholesterolemia, unspecified: Secondary | ICD-10-CM | POA: Diagnosis not present

## 2021-10-01 NOTE — Patient Instructions (Signed)
Medication Instructions:  ?No changes ?*If you need a refill on your cardiac medications before your next appointment, please call your pharmacy* ? ? ?Lab Work: ?No Labs ?If you have labs (blood work) drawn today and your tests are completely normal, you will receive your results only by: ?MyChart Message (if you have MyChart) OR ?A paper copy in the mail ?If you have any lab test that is abnormal or we need to change your treatment, we will call you to review the results. ? ? ?Testing/Procedures: ?No Testing ? ? ?Follow-Up: ?At [facility], you and your health needs are our priority.  As part of our continuing mission to provide you with exceptional heart care, we have created designated Provider Care Teams.  These Care Teams include your primary Cardiologist (physician) and Advanced Practice Providers (APPs -  Physician Assistants and Nurse Practitioners) who all work together to provide you with the care you need, when you need it. ? ?We recommend signing up for the patient portal called "MyChart".  Sign up information is provided on this After Visit Summary.  MyChart is used to connect with patients for Virtual Visits (Telemedicine).  Patients are able to view lab/test results, encounter notes, upcoming appointments, etc.  Non-urgent messages can be sent to your provider as well.   ?To learn more about what you can do with MyChart, go to [email or web address].   ? ?Your next appointment:   ?1 year(s) ? ?The format for your next appointment:   ?In Person ? ?Provider:   ?Sanda Klein, MD   ? ? ? ?

## 2021-11-24 ENCOUNTER — Telehealth: Payer: Self-pay

## 2021-11-24 NOTE — Telephone Encounter (Signed)
Ok for this? 

## 2021-11-24 NOTE — Telephone Encounter (Signed)
Patient needs a letter emailed to her stating that she can wear lace up tennis shoes to work. ?

## 2021-11-24 NOTE — Telephone Encounter (Signed)
yes

## 2021-11-25 NOTE — Telephone Encounter (Signed)
Emailed to [email or web address]. [email or web address] ? ?Patient aware. ? ?

## 2021-11-25 NOTE — Telephone Encounter (Signed)
Note made.  

## 2022-01-11 ENCOUNTER — Telehealth: Payer: Self-pay | Admitting: Cardiovascular Disease

## 2022-01-11 DIAGNOSIS — R002 Palpitations: Secondary | ICD-10-CM

## 2022-01-11 NOTE — Telephone Encounter (Signed)
Patient c/o Palpitations:  High priority if patient c/o lightheadedness, shortness of breath, or chest pain  How long have you had palpitations/irregular HR/ Afib? Are you having the symptoms now? Off and on for a couple months, maybe since she had covid, not happening now   Are you currently experiencing lightheadedness, SOB or CP? no  Do you have a history of afib (atrial fibrillation) or irregular heart rhythm? no  Have you checked your BP or HR? (document readings if available): BP was high last time she came to office but has been normal for her since  Are you experiencing any other symptoms? No   Patient states she has noticed an irregular heartbeat in the last couple months.

## 2022-01-11 NOTE — Telephone Encounter (Signed)
Spoke with pt regarding off and on palpitations for the last few weeks. Pt has started to notice them more and they are bothering her more. Pt does monitor her caffeine intake, having only one cup of coffee a day and then drinking water the rest of the day. Pt states that she spoke with Anderson Malta L. PA-C about these and it was suggested that she wear a monitor. Pt is ready to move forward with this. Will route to [place] to advise.

## 2022-01-13 NOTE — Telephone Encounter (Signed)
Per Caron Presume, PA-C Recommend:  2 week Zio patch, Dx: palpitations.  I can review results and then let her know if anything significant to warrant an appointment.   Thanks!  Brenda Ayala pt to discuss monitor. Orders placed- ordered a sensitive skin preventice because of pt's latex allergy. Instructions for monitor given to pt. Pt verbalizes understanding.

## 2022-02-09 ENCOUNTER — Telehealth: Payer: Self-pay | Admitting: Physician Assistant

## 2022-02-09 ENCOUNTER — Ambulatory Visit (INDEPENDENT_AMBULATORY_CARE_PROVIDER_SITE_OTHER): Payer: Managed Care, Other (non HMO)

## 2022-02-09 DIAGNOSIS — R002 Palpitations: Secondary | ICD-10-CM

## 2022-02-09 NOTE — Telephone Encounter (Signed)
New Message:     Patient would like to know the status of her Monitor that was oredered on 01-13-22.

## 2022-02-09 NOTE — Progress Notes (Unsigned)
Original order entered to be done by [facility] office.  Patient called 02/09/22 to inquire about status of monitor.  Order corrected to be done by [facility] office and patient enrolled for [employer] to ship a 14 day long term monitor to her address on file.  Sensitive skin electrodes requested. Called Mitch from [employer] to ask for shipping to be expedited.  Dr. Sallyanne Kuster to read.

## 2022-02-09 NOTE — Telephone Encounter (Signed)
Investigated order to find out the status of Brenda Ayala monitor.  Unfortunately to order was placed to be done by our [place] office, which, does not process outside orders.   I will correct the order and enroll the patient for [employer] to ship a 14 day long term holter monitor to her home.

## 2022-02-21 DIAGNOSIS — R002 Palpitations: Secondary | ICD-10-CM | POA: Diagnosis not present

## 2022-03-17 ENCOUNTER — Ambulatory Visit (INDEPENDENT_AMBULATORY_CARE_PROVIDER_SITE_OTHER): Payer: Managed Care, Other (non HMO) | Admitting: Pulmonary Disease

## 2022-03-17 ENCOUNTER — Encounter: Payer: Self-pay | Admitting: Pulmonary Disease

## 2022-03-17 VITALS — BP 128/64 | HR 62 | Ht 64.0 in | Wt 165.4 lb

## 2022-03-17 DIAGNOSIS — J479 Bronchiectasis, uncomplicated: Secondary | ICD-10-CM | POA: Diagnosis not present

## 2022-03-17 DIAGNOSIS — R053 Chronic cough: Secondary | ICD-10-CM | POA: Diagnosis not present

## 2022-03-17 DIAGNOSIS — R0989 Other specified symptoms and signs involving the circulatory and respiratory systems: Secondary | ICD-10-CM

## 2022-03-17 NOTE — Patient Instructions (Signed)
Chronic cough with mucus production and abnormal CT scan of the chest: High-resolution CT scan Pulmonary function test  Bronchiolectasis seen on CT scan: This term means that there is some inflammatory damage to the airways that can lead to mucus buildup Use a flutter valve 4 to 5 breaths, 4-5 times a day Practice good hand hygiene Stay active Make sure you immunizations are up-to-date: Pneumonia vaccine and flu vaccine Provide [place] with 3 samples of your mucus: We will test for bacterial, fungal, AFB organisms  We will plan on seeing you back in 6 to 8 weeks after the cultures are back

## 2022-03-17 NOTE — Progress Notes (Signed)
Synopsis: Referred in August 2023 for abnormal CT chest  Subjective:   PATIENT ID: Brenda Ayala: female DOB: 10/29/1955, MRN: [id number]   HPI  Chief Complaint  Patient presents with   Consult    Referred by PCP for chronic cough. States she has had a cough since 2022. She had COVID last year but had the cough before then. Cough tends to be more productive in the mornings after waking up.     66 y/o female here to see me for a cough.  > she said that it has been with her for a few years > she had COVID in 12/2020 that made it worse > it is typically dry > worse with talkiing a lot > coughs up green mucus in the mornings > She thinks it started in 2019 > She she works in Community education officer, has been around a lot of dust, fumes in the past but not so much now > no fever, chills, or weights, no night sweats  COVID: > took paxlovid > had back pain, chest pain > felt "really sick" for 8 days > kept working > she was prescribed an inhaler, didn't help with the mucus production  Doesn't really have sinus symptoms. No heartburn or indigestino.  Dyspnea:  > started after COVID > feels it when she is swimming, staying at 20 minutes at a time > she walks 14,000 steps a day, initially felt short of breath but it's better now  She has never had a respiratory illnesses in the past.  She never smoked cigarettes, some pot in high school.  Her father had emphysema, no family history of lung disease.    She had a CT scan in 06/2021 that had some scarring.   Never been told she had a connective tissue disease.  Never had pneumonia.  Doesn't have trouble swallowing.    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  Her EF was mildly reduced to 50-55%     Family History  Problem Relation Age of Onset   Heart disease Mother    Colon cancer Neg Hx    Colon polyps Neg Hx    Esophageal cancer Neg Hx    Rectal cancer Neg Hx    Stomach cancer Neg Hx      Social History   Socioeconomic History   Marital status: Married    Spouse name: Not on file   Number of children: Not on file   Years of education: Not on file   Highest education level: Not on file  Occupational History   Not on file  Tobacco Use   Smoking status: Never   Smokeless tobacco: Never  Substance and Sexual Activity   Alcohol use: Yes    Alcohol/week: 0.0 standard drinks of alcohol    Comment: drinks wine occasionally--3 times a week    Drug use: No   Sexual activity: Yes    Birth control/protection: Post-menopausal  Other Topics Concern   Not on file  Social History Narrative   Marital Status: Married Copy)   Children: Sons (2); Daughter (1)     Pets: None   Living Situation: Lives with husband    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[place])    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[employer])     Tobacco Use/Exposure:  None    Alcohol Use:  Occasional   Drug Use:  None   Diet:  Regular   Exercise:  Pilates (1  X Per Week); Walking (4 X Per Week)     Hobbies: Community education officer    Social Determinants of Radio broadcast assistant Strain: Not on file  Food Insecurity: Not on file  Transportation Needs: Not on file  Physical Activity: Not on file  Stress: Not on file  Social Connections: Not on file  Intimate Partner Violence: Not on file     Allergies  Allergen Reactions   Latex Rash    Rash and blisters    Sulfa Antibiotics Rash     Outpatient Medications Prior to Visit   Medication Sig Dispense Refill   amLODipine (NORVASC) 2.5 MG tablet Take by mouth.     atorvastatin (LIPITOR) 40 MG tablet TAKE 1 TABLET(40 MG) BY MOUTH DAILY 90 tablet 3   Magnesium 500 MG TABS Take 1 tablet by mouth daily.     meclizine (ANTIVERT) 25 MG tablet Take by mouth.     ondansetron (ZOFRAN) 4 MG tablet Take by mouth.     Potassium Gluconate 595 MG CAPS Take 595 mg by mouth daily.     telmisartan (MICARDIS) 40 MG tablet TAKE 1 TABLET BY MOUTH DAILY 90 tablet 3   No facility-administered medications prior to visit.    Review of Systems  Constitutional:  Negative for chills, fever, malaise/fatigue and weight loss.  HENT:  Negative for congestion, nosebleeds, sinus pain and sore throat.   Eyes:  Negative for photophobia, pain and discharge.  Respiratory:  Positive for cough and sputum production. Negative for hemoptysis, shortness of breath and wheezing.   Cardiovascular:  Negative for chest pain, palpitations, orthopnea and leg swelling.  Gastrointestinal:  Negative for abdominal pain, constipation, diarrhea, nausea and vomiting.  Genitourinary:  Negative for dysuria, frequency, hematuria and urgency.  Musculoskeletal:  Negative for back pain, joint pain, myalgias and neck pain.  Skin:  Negative for itching and rash.  Neurological:  Negative for tingling, tremors, sensory change, speech change, focal weakness, seizures, weakness and headaches.  Psychiatric/Behavioral:  Negative for memory loss, substance abuse and suicidal ideas. The patient is not nervous/anxious.       Objective:  Physical Exam   Vitals:   03/17/22 1436  BP: 128/64  Pulse: 62  SpO2: 96%  Weight: 165 lb 6.4 oz (75 kg)  Height: '5\' 4"'$  (1.626 m)    Gen: well appearing, no acute distress HENT: NCAT, OP clear, neck supple without masses Eyes: PERRL, EOMi Lymph: no cervical lymphadenopathy PULM: Coarse crackles bases B CV: RRR, no mgr, no JVD GI: BS+, soft, nontender, no hsm Derm: no rash or skin  breakdown MSK: normal bulk and tone Neuro: A&Ox4, CN II-XII intact, strength 5/5 in all 4 extremities Psyche: normal mood and affect   CBC No results found for: "WBC", "RBC", "HGB", "HCT", "PLT", "MCV", "MCH", "MCHC", "RDW", "LYMPHSABS", "MONOABS", "EOSABS", "BASOSABS"   Chest imaging: November 2022 CT chest showing nonspecific fibrotic changes in the bases of her lungs 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  I explained to her today that the differential diagnosis here is broad and that finding could be related to the COVID infection she had earlier that year, however there are multiple causes of UIP that we need to investigate further.  Chronic cough with mucus production and abnormal CT scan of the chest: High-resolution CT scan Pulmonary function test  Bronchiolectasis seen on CT scan: This term means that there is some inflammatory damage to the airways that can lead to mucus buildup Use a flutter valve 4 to 5 breaths, 4-5 times a day Practice good hand hygiene Stay active Make sure you immunizations are up-to-date: Pneumonia vaccine and flu vaccine Provide [place] with 3 samples of your mucus: We will test for bacterial, fungal, AFB organisms  We will plan on seeing you back in 6 to 8 weeks after the cultures are back     Current Outpatient Medications:    amLODipine (NORVASC) 2.5 MG tablet, Take by mouth., Disp: , Rfl:    atorvastatin (LIPITOR) 40 MG tablet,  TAKE 1 TABLET(40 MG) BY MOUTH DAILY, Disp: 90 tablet, Rfl: 3   Magnesium 500 MG TABS, Take 1 tablet by mouth daily., Disp: , Rfl:    meclizine (ANTIVERT) 25 MG tablet, Take by mouth., Disp: , Rfl:    ondansetron (ZOFRAN) 4 MG tablet, Take by mouth., Disp: , Rfl:    Potassium Gluconate 595 MG CAPS, Take 595 mg by mouth daily., Disp: , Rfl:    telmisartan (MICARDIS) 40 MG tablet, TAKE 1 TABLET BY MOUTH DAILY, Disp: 90 tablet, Rfl: 3

## 2022-03-24 ENCOUNTER — Telehealth: Payer: Self-pay | Admitting: Pulmonary Disease

## 2022-03-24 ENCOUNTER — Ambulatory Visit (HOSPITAL_COMMUNITY): Payer: Managed Care, Other (non HMO)

## 2022-03-25 NOTE — Telephone Encounter (Signed)
Called and spoke with pt who states she started taking azithromycin 8/14 and states she finished it 8/19. Pt said she had food poisoning and had salmonella and campylobacter from this and was placed on the azithromycin.  Pt said she has not been able to give a mucus sample yet and states if the mucus returns she would try to do a sample. Routing to Dr. Lake Bells as an Juluis Rainier.

## 2022-03-25 NOTE — Telephone Encounter (Signed)
Called and left voicemail for patient to call office back. I advised her that we need to obtain more information on the antibiotic that she is on. And then we can update Dr Lake Bells.

## 2022-03-25 NOTE — Telephone Encounter (Signed)
Patient is returning phone call. States antibotic was Azithromycin 500 mg. Finished 2 rounds of 6 pills. Patient phone number is [phone].

## 2022-04-09 ENCOUNTER — Ambulatory Visit
Admission: RE | Admit: 2022-04-09 | Discharge: 2022-04-09 | Disposition: A | Discharge status: Home / Self Care | Payer: Managed Care, Other (non HMO) | Source: Ambulatory Visit | Attending: Pulmonary Disease | Admitting: Pulmonary Disease

## 2022-04-09 DIAGNOSIS — R053 Chronic cough: Secondary | ICD-10-CM

## 2022-05-04 ENCOUNTER — Ambulatory Visit (INDEPENDENT_AMBULATORY_CARE_PROVIDER_SITE_OTHER): Payer: Managed Care, Other (non HMO) | Admitting: Pulmonary Disease

## 2022-05-04 ENCOUNTER — Encounter: Payer: Self-pay | Admitting: Pulmonary Disease

## 2022-05-04 ENCOUNTER — Other Ambulatory Visit: Payer: Managed Care, Other (non HMO)

## 2022-05-04 VITALS — BP 132/74 | HR 72 | Ht 65.0 in | Wt 162.0 lb

## 2022-05-04 DIAGNOSIS — J849 Interstitial pulmonary disease, unspecified: Secondary | ICD-10-CM

## 2022-05-04 DIAGNOSIS — J479 Bronchiectasis, uncomplicated: Secondary | ICD-10-CM

## 2022-05-04 DIAGNOSIS — Z23 Encounter for immunization: Secondary | ICD-10-CM | POA: Diagnosis not present

## 2022-05-04 DIAGNOSIS — R1319 Other dysphagia: Secondary | ICD-10-CM

## 2022-05-04 DIAGNOSIS — R053 Chronic cough: Secondary | ICD-10-CM | POA: Diagnosis not present

## 2022-05-04 LAB — PULMONARY FUNCTION TEST
DL/VA % pred: 94 %
DL/VA: 3.9 ml/min/mmHg/L
DLCO cor % pred: 66 %
DLCO cor: 13.5 ml/min/mmHg
DLCO unc % pred: 66 %
DLCO unc: 13.54 ml/min/mmHg
FEF 25-75 Post: 2.14 L/sec
FEF 25-75 Pre: 2.17 L/sec
FEF2575-%Change-Post: -1 %
FEF2575-%Pred-Post: 101 %
FEF2575-%Pred-Pre: 103 %
FEV1-%Change-Post: -2 %
FEV1-%Pred-Post: 66 %
FEV1-%Pred-Pre: 67 %
FEV1-Post: 1.63 L
FEV1-Pre: 1.67 L
FEV1FVC-%Change-Post: 0 %
FEV1FVC-%Pred-Pre: 113 %
FEV6-%Change-Post: -2 %
FEV6-%Pred-Post: 61 %
FEV6-%Pred-Pre: 62 %
FEV6-Post: 1.89 L
FEV6-Pre: 1.93 L
FEV6FVC-%Pred-Post: 104 %
FEV6FVC-%Pred-Pre: 104 %
FVC-%Change-Post: -2 %
FVC-%Pred-Post: 58 %
FVC-%Pred-Pre: 59 %
FVC-Post: 1.89 L
FVC-Pre: 1.93 L
Post FEV1/FVC ratio: 86 %
Post FEV6/FVC ratio: 100 %
Pre FEV1/FVC ratio: 87 %
Pre FEV6/FVC Ratio: 100 %
RV % pred: 68 %
RV: 1.49 L
TLC % pred: 69 %
TLC: 3.61 L

## 2022-05-04 LAB — C-REACTIVE PROTEIN: CRP: 2.1 mg/dL (ref 0.5–20.0)

## 2022-05-04 LAB — SEDIMENTATION RATE: Sed Rate: 36 mm/hr — ABNORMAL HIGH (ref 0–30)

## 2022-05-04 NOTE — Addendum Note (Signed)
Addended by: Valerie Salts on: 05/04/2022 04:28 PM   Modules accepted: Orders

## 2022-05-04 NOTE — Progress Notes (Signed)
Synopsis: Referred in August 2023 for abnormal CT chest suggestive of UIP associated with chronic cough. She had a severe episode of aspiration pneumonia when she was a high schooler which required a hospitalization.  Subjective:   PATIENT ID: Brenda Ayala GENDER: female DOB: 1956/03/12, MRN: [id number]   HPI  Chief Complaint  Patient presents with   Follow-up    F/U after PFT. States she was an abx for food poisoning which helped to clear cough.     Shizuye had food poisoning and she was on azithromycin and her cough went away. She still has some residual cough but it's not too bad Her mucus production is down, she hasn't been able to produce much mucus. She tells me today about a severe aspiration event she had many years ago.  She also notes that when she was pregnant she would have an increased sense of heartburn and fullness.  She says that her cough is starting to come back but it is dry, she is not producing any mucus.  Past Medical History:  Diagnosis Date   Fracture of left foot    Fracture of left pelvis (HCC)    Heart murmur    Hyperlipidemia    Hypertension    LBBB (left bundle branch block)    Echo in 2009 at [facility] showed mild MR and interventricular septal dyssynergy due to the LBBB.  Her EF was mildly reduced to 50-55%      Review of Systems  Constitutional:  Negative for chills, fever, malaise/fatigue and weight loss.  HENT:  Negative for congestion, nosebleeds, sinus pain and sore throat.   Eyes:  Negative for photophobia, pain and discharge.  Respiratory:  Positive for sputum production. Negative for cough, hemoptysis, shortness of breath and wheezing.   Cardiovascular:  Negative for chest pain, palpitations, orthopnea and leg swelling.  Gastrointestinal:  Negative for abdominal pain, constipation, diarrhea, nausea and vomiting.  Genitourinary:  Negative for dysuria, frequency, hematuria and urgency.  Musculoskeletal:  Negative for back pain,  joint pain, myalgias and neck pain.  Skin:  Negative for itching and rash.  Neurological:  Negative for tingling, tremors, sensory change, speech change, focal weakness, seizures, weakness and headaches.  Psychiatric/Behavioral:  Negative for memory loss, substance abuse and suicidal ideas. The patient is not nervous/anxious.       Objective:  Physical Exam   Vitals:   05/04/22 1023  BP: 132/74  Pulse: 72  SpO2: 97%  Weight: 162 lb (73.5 kg)  Height: '5\' 5"'$  (1.651 m)    Gen: well appearing HENT: OP clear, TM's clear, neck supple PULM: C coarse crackles bases B, normal percussion CV: RRR, systolic murmur noted, trace edema GI: BS+, soft, nontender Derm: no cyanosis or rash Psyche: normal mood and affect     CBC No results found for: "WBC", "RBC", "HGB", "HCT", "PLT", "MCV", "MCH", "MCHC", "RDW", "LYMPHSABS", "MONOABS", "EOSABS", "BASOSABS"   Chest imaging: November 2022 CT chest showing nonspecific fibrotic changes in the bases of her lungs 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 Rosario Jacks, in general findings worse in bases, hiatal hernia noted  PFT: May 04, 2022 ratio 86%, FVC 1.89 L 58% predicted, total lung capacity 3.61 L 69% predicted, DLCO 13.5 mL/mmHg 66% predicted  Labs:  Path:  Echo:  Heart Catheterization:       Assessment & Plan:  ILD (interstitial lung disease) ([place]) - Plan: Ambulatory referral to Pulmonology, Aldolase, ANA, ANCA screen with reflex titer, Anti-DNA antibody, double-stranded, Anti-Jo 1 antibody, IgG, Anti-scleroderma antibody, Anti-Smith antibody, Centromere Antibodies, CK total and CKMB (cardiac)not at [facility], C-reactive protein, Cyclic citrul peptide antibody, IgG, Hypersensitivity Pneumonitis, Rheumatoid factor, RNP Antibodies,  Sedimentation rate, Sjogren's syndrome antibods(ssa + ssb)  Bronchiolectasis (HCC)  Chronic cough  Esophageal dysphagia  Discussion: [name] returns to clinic today for evaluation of diffuse parenchymal lung disease.  She has an atypical pattern, right lower lobe groundglass, some interstitial opacification, however she also has left upper lobe groundglass opacification.  Hiatal hernia was noted.  Findings were not consistent with UIP, no clear disease pattern identified.  I explained to the patient today that the differential diagnosis is broad and includes chronic aspiration given her history of aspiration pneumonia and hiatal hernia, post-COVID changes, or less likely an autoimmune disease causing underlying lung disease.  Because she has some degree of structural lung disease she is at increased risk for an atypical lung infection, though I do not think this would be the unifying diagnosis for the abnormality seen on the CT scan of her chest.  I think which most likely is that she has chronic aspiration and is at increased risk for recurrent or chronic infection.  Plan: History of aspiration, dysphagia: Barium esophagram  Diffuse parenchymal lung disease: As discussed today, there are many possible causes for this We will check blood work today to look for underlying autoimmune illnesses which can be associated with diffuse parenchymal lung disease We will perform a bronchoscopy at [facility] where we will perform transbronchial biopsies and a bronchioloalveolar lavage to assess further We will send cultures from that study as well to give [place] an idea as to whether or not you may have a chronic underlying lung infection. We will call you to make plans for that  I will plan on seeing you back in 4 weeks or sooner if needed   Current Outpatient Medications:    amLODipine (NORVASC) 2.5 MG tablet, Take by mouth., Disp: , Rfl:    atorvastatin (LIPITOR) 40 MG tablet, TAKE 1 TABLET(40  MG) BY MOUTH DAILY, Disp: 90 tablet, Rfl: 3   Magnesium 500 MG TABS, Take 1 tablet by mouth daily., Disp: , Rfl:    Potassium Gluconate 595 MG CAPS, Take 595 mg by mouth daily., Disp: , Rfl:    telmisartan (MICARDIS) 40 MG tablet, TAKE 1 TABLET BY MOUTH DAILY, Disp: 90 tablet, Rfl: 3

## 2022-05-04 NOTE — Patient Instructions (Signed)
Full PFT Performed Today  

## 2022-05-04 NOTE — Patient Instructions (Signed)
History of aspiration, dysphagia: Barium esophagram  Diffuse parenchymal lung disease: As discussed today, there are many possible causes for this We will check blood work today to look for underlying autoimmune illnesses which can be associated with diffuse parenchymal lung disease We will perform a bronchoscopy at [facility] where we will perform transbronchial biopsies and a bronchioloalveolar lavage to assess further We will send cultures from that study as well to give [place] an idea as to whether or not you may have a chronic underlying lung infection. We will call you to make plans for that  I will plan on seeing you back in 4 weeks or sooner if needed

## 2022-05-04 NOTE — Progress Notes (Signed)
Full PFT Performed Today  

## 2022-05-04 NOTE — Addendum Note (Signed)
Addended by: Valerie Salts on: 05/04/2022 12:09 PM   Modules accepted: Orders

## 2022-05-05 ENCOUNTER — Telehealth (HOSPITAL_COMMUNITY): Payer: Self-pay

## 2022-05-05 ENCOUNTER — Other Ambulatory Visit (HOSPITAL_COMMUNITY): Payer: Self-pay

## 2022-05-05 DIAGNOSIS — R1310 Dysphagia, unspecified: Secondary | ICD-10-CM

## 2022-05-05 NOTE — Telephone Encounter (Signed)
Attempted to contact patient to schedule Modified Barium Swallow - left voicemail. 

## 2022-05-06 LAB — CK TOTAL AND CKMB (NOT AT ARMC)
CK, MB: 0.9 ng/mL (ref 0–5.0)
Relative Index: 1.2 (ref 0–4.0)
Total CK: 73 U/L (ref 29–143)

## 2022-05-06 LAB — ALDOLASE: Aldolase: 4.5 U/L (ref <=8.1)

## 2022-05-07 LAB — HYPERSENSITIVITY PNEUMONITIS
A. Pullulans Abs: NEGATIVE
A.Fumigatus #1 Abs: NEGATIVE
Micropolyspora faeni, IgG: NEGATIVE
Pigeon Serum Abs: NEGATIVE
Thermoact. Saccharii: NEGATIVE
Thermoactinomyces vulgaris, IgG: NEGATIVE

## 2022-05-07 LAB — ANTI-JO 1 ANTIBODY, IGG: Anti JO-1: <0.2 AI (ref 0.0–0.9)

## 2022-05-07 LAB — RNP ANTIBODIES: ENA RNP Ab: <0.2 AI (ref 0.0–0.9)

## 2022-05-08 LAB — ANA: Anti Nuclear Antibody (ANA): POSITIVE — AB

## 2022-05-08 LAB — ANTI-SCLERODERMA ANTIBODY: Scleroderma (Scl-70) (ENA) Antibody, IgG: <1 AI

## 2022-05-08 LAB — RHEUMATOID FACTOR: Rheumatoid fact SerPl-aCnc: <14 IU/mL (ref <14)

## 2022-05-08 LAB — ANTI-NUCLEAR AB-TITER (ANA TITER): ANA Titer 1: 1:320 {titer} — ABNORMAL HIGH

## 2022-05-08 LAB — CENTROMERE ANTIBODIES: Centromere Ab Screen: <1 AI

## 2022-05-08 LAB — SJOGREN'S SYNDROME ANTIBODS(SSA + SSB)
SSA (Ro) (ENA) Antibody, IgG: <1 AI
SSB (La) (ENA) Antibody, IgG: <1 AI

## 2022-05-08 LAB — ANTI-DNA ANTIBODY, DOUBLE-STRANDED: ds DNA Ab: 1 IU/mL

## 2022-05-08 LAB — ANTI-SMITH ANTIBODY: ENA SM Ab Ser-aCnc: <1 AI

## 2022-05-08 LAB — ANCA SCREEN W REFLEX TITER: ANCA SCREEN: NEGATIVE

## 2022-05-08 LAB — CYCLIC CITRUL PEPTIDE ANTIBODY, IGG: Cyclic Citrullin Peptide Ab: <16 UNITS

## 2022-05-12 ENCOUNTER — Ambulatory Visit (HOSPITAL_COMMUNITY)
Admission: RE | Admit: 2022-05-12 | Discharge: 2022-05-12 | Disposition: A | Discharge status: Home / Self Care | Payer: Managed Care, Other (non HMO) | Source: Ambulatory Visit

## 2022-05-12 ENCOUNTER — Ambulatory Visit (HOSPITAL_COMMUNITY)
Admission: RE | Admit: 2022-05-12 | Discharge: 2022-05-12 | Disposition: A | Discharge status: Home / Self Care | Payer: Managed Care, Other (non HMO) | Source: Ambulatory Visit | Attending: Pulmonary Disease | Admitting: Pulmonary Disease

## 2022-05-12 DIAGNOSIS — R1310 Dysphagia, unspecified: Secondary | ICD-10-CM

## 2022-05-12 DIAGNOSIS — R1319 Other dysphagia: Secondary | ICD-10-CM | POA: Insufficient documentation

## 2022-05-12 NOTE — Progress Notes (Signed)
Modified Barium Swallow Progress Note  Patient Details  Name: Brenda Ayala MRN: [id number] Date of Birth: 25-Mar-1956  Today's Date: 05/12/2022  Modified Barium Swallow completed.  Full report located under Chart Review in the Imaging Section.  Brief recommendations include the following:  Clinical Impression  Pt demonstrates no oral or oropharyngeal dysphagia. Esophageal sweep appears WNL with solid and liquids. Brief hesitation of pill at GE junction; cleared with a second sip. No recommendations at this time.   Swallow Evaluation Recommendations       SLP Diet Recommendations: Regular solids;Thin liquid   Liquid Administration via: Cup;Straw    Brenda Ayala, Katherene Ponto 05/12/2022,12:54 PM

## 2022-05-24 ENCOUNTER — Other Ambulatory Visit: Payer: Self-pay | Admitting: *Deleted

## 2022-05-24 ENCOUNTER — Other Ambulatory Visit: Payer: Managed Care, Other (non HMO)

## 2022-05-24 DIAGNOSIS — Z01818 Encounter for other preprocedural examination: Secondary | ICD-10-CM

## 2022-05-25 ENCOUNTER — Encounter (HOSPITAL_COMMUNITY): Payer: Self-pay | Admitting: Pulmonary Disease

## 2022-05-25 ENCOUNTER — Other Ambulatory Visit: Payer: Self-pay

## 2022-05-25 NOTE — Progress Notes (Signed)
Spoke with pt for pre-op call. Pt denies cardiac history and Diabetes, but is treated for HTN.   Covid test done yesterday, no results noted at this time.   Shower instructions given to pt and she voiced understanding.

## 2022-05-26 ENCOUNTER — Ambulatory Visit (HOSPITAL_COMMUNITY)
Admission: RE | Admit: 2022-05-26 | Discharge: 2022-05-26 | Disposition: A | Discharge status: Home / Self Care | Payer: Managed Care, Other (non HMO) | Attending: Pulmonary Disease | Admitting: Pulmonary Disease

## 2022-05-26 ENCOUNTER — Encounter (HOSPITAL_COMMUNITY)
Admission: RE | Disposition: A | Discharge status: Home / Self Care | Payer: Self-pay | Source: Home / Self Care | Attending: Pulmonary Disease

## 2022-05-26 ENCOUNTER — Ambulatory Visit (HOSPITAL_BASED_OUTPATIENT_CLINIC_OR_DEPARTMENT_OTHER): Payer: Managed Care, Other (non HMO) | Admitting: Certified Registered"

## 2022-05-26 ENCOUNTER — Ambulatory Visit (HOSPITAL_COMMUNITY): Payer: Managed Care, Other (non HMO) | Admitting: Certified Registered"

## 2022-05-26 ENCOUNTER — Encounter (HOSPITAL_COMMUNITY): Payer: Self-pay | Admitting: Pulmonary Disease

## 2022-05-26 ENCOUNTER — Ambulatory Visit (HOSPITAL_COMMUNITY): Payer: Managed Care, Other (non HMO)

## 2022-05-26 ENCOUNTER — Other Ambulatory Visit: Payer: Self-pay

## 2022-05-26 DIAGNOSIS — J849 Interstitial pulmonary disease, unspecified: Secondary | ICD-10-CM | POA: Diagnosis not present

## 2022-05-26 DIAGNOSIS — J40 Bronchitis, not specified as acute or chronic: Secondary | ICD-10-CM | POA: Diagnosis not present

## 2022-05-26 DIAGNOSIS — I10 Essential (primary) hypertension: Secondary | ICD-10-CM | POA: Insufficient documentation

## 2022-05-26 DIAGNOSIS — U099 Post covid-19 condition, unspecified: Secondary | ICD-10-CM

## 2022-05-26 DIAGNOSIS — J984 Other disorders of lung: Secondary | ICD-10-CM | POA: Insufficient documentation

## 2022-05-26 DIAGNOSIS — R0600 Dyspnea, unspecified: Secondary | ICD-10-CM | POA: Diagnosis present

## 2022-05-26 DIAGNOSIS — R053 Chronic cough: Secondary | ICD-10-CM

## 2022-05-26 HISTORY — PX: VIDEO BRONCHOSCOPY: SHX5072

## 2022-05-26 HISTORY — PX: BRONCHIAL WASHINGS: SHX5105

## 2022-05-26 HISTORY — DX: Pneumonia, unspecified organism: J18.9

## 2022-05-26 HISTORY — PX: BRONCHIAL BIOPSY: SHX5109

## 2022-05-26 LAB — BASIC METABOLIC PANEL
Anion gap: 10 (ref 5–15)
BUN: 16 mg/dL (ref 8–23)
CO2: 23 mmol/L (ref 22–32)
Calcium: 9.5 mg/dL (ref 8.9–10.3)
Chloride: 101 mmol/L (ref 98–111)
Creatinine, Ser: 0.69 mg/dL (ref 0.44–1.00)
GFR, Estimated: >60 mL/min (ref >60)
Glucose, Bld: 105 mg/dL — ABNORMAL HIGH (ref 70–99)
Potassium: 3.8 mmol/L (ref 3.5–5.1)
Sodium: 134 mmol/L — ABNORMAL LOW (ref 135–145)

## 2022-05-26 LAB — CBC
HCT: 41.1 % (ref 36.0–46.0)
Hemoglobin: 13.9 g/dL (ref 12.0–15.0)
MCH: 31.7 pg (ref 26.0–34.0)
MCHC: 33.8 g/dL (ref 30.0–36.0)
MCV: 93.8 fL (ref 80.0–100.0)
Platelets: 334 10*3/uL (ref 150–400)
RBC: 4.38 MIL/uL (ref 3.87–5.11)
RDW: 13.4 % (ref 11.5–15.5)
WBC: 7.4 10*3/uL (ref 4.0–10.5)
nRBC: 0 % (ref 0.0–0.2)

## 2022-05-26 LAB — NOVEL CORONAVIRUS, NAA: SARS-CoV-2, NAA: NOT DETECTED

## 2022-05-26 LAB — SPECIMEN STATUS REPORT

## 2022-05-26 SURGERY — BRONCHOSCOPY, WITH FLUOROSCOPY
Anesthesia: General

## 2022-05-26 MED ORDER — LACTATED RINGERS IV SOLN: INTRAVENOUS | Status: DC

## 2022-05-26 MED ORDER — ONDANSETRON HCL 4 MG/2ML IJ SOLN
INTRAMUSCULAR | Status: DC | PRN
  Administered 2022-05-26: 4 mg via INTRAVENOUS

## 2022-05-26 MED ORDER — ACETAMINOPHEN 10 MG/ML IV SOLN
INTRAVENOUS | Status: AC
  Filled 2022-05-26: qty 100

## 2022-05-26 MED ORDER — MIDAZOLAM HCL 5 MG/5ML IJ SOLN
INTRAMUSCULAR | Status: DC | PRN
  Administered 2022-05-26: 1 mg via INTRAVENOUS

## 2022-05-26 MED ORDER — CHLORHEXIDINE GLUCONATE 0.12 % MT SOLN: 15.0000 mL | Freq: Once | OROMUCOSAL | Status: AC

## 2022-05-26 MED ORDER — ACETAMINOPHEN 10 MG/ML IV SOLN
1000.0000 mg | Freq: Once | INTRAVENOUS | Status: AC
  Administered 2022-05-26: 1000 mg via INTRAVENOUS

## 2022-05-26 MED ORDER — DEXAMETHASONE SODIUM PHOSPHATE 10 MG/ML IJ SOLN
INTRAMUSCULAR | Status: DC | PRN
  Administered 2022-05-26: 5 mg via INTRAVENOUS

## 2022-05-26 MED ORDER — CHLORHEXIDINE GLUCONATE 0.12 % MT SOLN
OROMUCOSAL | Status: AC
  Administered 2022-05-26: 15 mL via OROMUCOSAL
  Filled 2022-05-26: qty 15

## 2022-05-26 MED ORDER — ROCURONIUM BROMIDE 100 MG/10ML IV SOLN
INTRAVENOUS | Status: DC | PRN
  Administered 2022-05-26: 30 mg via INTRAVENOUS

## 2022-05-26 MED ORDER — BENZONATATE 100 MG PO CAPS: 200.0000 mg | ORAL_CAPSULE | Freq: Three times a day (TID) | ORAL | 0 refills | Status: DC | PRN

## 2022-05-26 MED ORDER — LIDOCAINE 2% (20 MG/ML) 5 ML SYRINGE
INTRAMUSCULAR | Status: DC | PRN
  Administered 2022-05-26: 60 mg via INTRAVENOUS

## 2022-05-26 MED ORDER — AZITHROMYCIN 250 MG PO TABS: ORAL_TABLET | ORAL | 0 refills | Status: AC

## 2022-05-26 MED ORDER — SUGAMMADEX SODIUM 200 MG/2ML IV SOLN
INTRAVENOUS | Status: DC | PRN
  Administered 2022-05-26: 200 mg via INTRAVENOUS

## 2022-05-26 MED ORDER — PROPOFOL 10 MG/ML IV BOLUS
INTRAVENOUS | Status: DC | PRN
  Administered 2022-05-26: 20 mg via INTRAVENOUS
  Administered 2022-05-26: 120 mg via INTRAVENOUS

## 2022-05-26 MED ORDER — PROPOFOL 500 MG/50ML IV EMUL
INTRAVENOUS | Status: DC | PRN
  Administered 2022-05-26: 150 ug/kg/min via INTRAVENOUS

## 2022-05-26 MED ORDER — FENTANYL CITRATE (PF) 100 MCG/2ML IJ SOLN
INTRAMUSCULAR | Status: DC | PRN
  Administered 2022-05-26: 50 ug via INTRAVENOUS

## 2022-05-26 NOTE — Anesthesia Postprocedure Evaluation (Signed)
Anesthesia Post Note  Patient: Arizbeth Cast  Procedure(s) Performed: VIDEO BRONCHOSCOPY WITH FLUORO BRONCHIAL BIOPSIES BRONCHIAL WASHINGS     Patient location during evaluation: PACU Anesthesia Type: General Level of consciousness: awake and alert Pain management: pain level controlled Vital Signs Assessment: post-procedure vital signs reviewed and stable Respiratory status: spontaneous breathing, nonlabored ventilation, respiratory function stable and patient connected to nasal cannula oxygen Cardiovascular status: blood pressure returned to baseline and stable Postop Assessment: no apparent nausea or vomiting Anesthetic complications: no   No notable events documented.  Last Vitals:  Vitals:   05/26/22 1100 05/26/22 1115  BP: (!) 120/108 (!) 147/66  Pulse: 66 66  Resp: 17 (!) 22  Temp:  36.5 C  SpO2: 96% 94%    Last Pain:  Vitals:   05/26/22 1115  TempSrc:   PainSc: 0-No pain                 Tiajuana Amass

## 2022-05-26 NOTE — H&P (Signed)
[facility]  CC: dyspnea, cough HPI:  66 y/o female with poorly understood diffuse parenchymal lung disease and recurrent bronchitis syndrome.  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  Her EF was mildly reduced to 50-55%   Pneumonia      Family History  Problem Relation Age of Onset   Heart disease Mother    Colon cancer Neg Hx    Colon polyps Neg Hx    Esophageal cancer Neg Hx    Rectal cancer Neg Hx    Stomach cancer Neg Hx      Social History   Socioeconomic History   Marital status: Married    Spouse name: Not on file   Number of children: Not on file   Years of education: Not on file   Highest education level: Not on file  Occupational History   Not on file  Tobacco Use   Smoking status: Never   Smokeless tobacco: Never  Vaping Use   Vaping Use: Never used  Substance and Sexual Activity   Alcohol use: Yes    Alcohol/week: 0.0 standard drinks of alcohol    Comment: drinks wine occasionally--3 times a week    Drug use: No   Sexual activity: Yes    Birth control/protection: Post-menopausal  Other Topics Concern   Not on file  Social History Narrative   Marital Status: Married Copy)   Children: Sons (2); Daughter (1)     Pets: None   Living Situation: Lives with husband    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[place])    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[employer])     Tobacco Use/Exposure:  None    Alcohol Use:  Occasional   Drug Use:  None   Diet:  Regular   Exercise:  Pilates (1  X Per Week); Walking (4 X Per Week)     Hobbies: Community education officer    Social Determinants of Radio broadcast assistant Strain: Not on file  Food Insecurity: Not on file  Transportation Needs: Not  on file  Physical Activity: Not on file  Stress: Not on file  Social Connections: Not on file  Intimate Partner Violence: Not on file     Allergies  Allergen Reactions   Latex Rash    Rash and blisters    Sulfa Antibiotics Rash     _0 @  Vitals:   05/26/22 0714  BP: (!) 155/85  Pulse: 60  Resp: 18  Temp: 98 F (36.7 C)  TempSrc: Oral  SpO2: 93%  Weight: 71.2 kg  Height: _1  (1.651 m)   General:  Resting comfortably in bed HENT: NCAT OP clear PULM: CTA B, normal effort CV: RRR, no mgr GI: BS+, soft, nontender MSK: normal bulk and tone Neuro: awake, alert, no distress, MAEW  CBC No results found for: "WBC", "RBC", "HGB", "HCT", "PLT", "MCV", "MCH", "MCHC", "RDW", "LYMPHSABS", "MONOABS", "EOSABS", "BASOSABS"  Impression: Diffuse parenchymal lung disease Cough Bronchitis  Plan: Bronchoscopy with BAL and biopsy today  Roselie Awkward, MD [place] [facility] Pager: [phone] Cell: [phone] After 7:00 pm call Elink  [phone]

## 2022-05-26 NOTE — Anesthesia Preprocedure Evaluation (Signed)
Anesthesia Evaluation  Patient identified by MRN, date of birth, ID band Patient awake    Reviewed: Allergy & Precautions, NPO status , Patient's Chart, lab work & pertinent test results  Airway Mallampati: II  TM Distance: >3 FB     Dental   Pulmonary shortness of breath,  Cough   breath sounds clear to auscultation       Cardiovascular hypertension, + dysrhythmias  Rhythm:Regular Rate:Normal     Neuro/Psych negative neurological ROS     GI/Hepatic negative GI ROS, Neg liver ROS,   Endo/Other  negative endocrine ROS  Renal/GU negative Renal ROS     Musculoskeletal  (+) Arthritis ,   Abdominal   Peds negative pediatric ROS (+)  Hematology negative hematology ROS (+)   Anesthesia Other Findings   Reproductive/Obstetrics                             Anesthesia Physical Anesthesia Plan  ASA: 2  Anesthesia Plan: General   Post-op Pain Management:    Induction: Intravenous  PONV Risk Score and Plan: 3 and Dexamethasone, Ondansetron and Treatment may vary due to age or medical condition  Airway Management Planned: Oral ETT  Additional Equipment: None  Intra-op Plan:   Post-operative Plan: Extubation in OR  Informed Consent: I have reviewed the patients History and Physical, chart, labs and discussed the procedure including the risks, benefits and alternatives for the proposed anesthesia with the patient or authorized representative who has indicated his/her understanding and acceptance.     Dental advisory given  Plan Discussed with: CRNA  Anesthesia Plan Comments:         Anesthesia Quick Evaluation

## 2022-05-26 NOTE — Op Note (Signed)
[facility] Cardiopulmonary Patient Name: Brenda Ayala Date: 05/26/2022 MRN: [id number] Attending MD: Juanito Doom , MD,  Date of Birth: 11/07/1955 CSN: Finalized Age: 66 Admit Type: Outpatient Gender: Female Procedure:             Bronchoscopy Indications:           Interstitial lung disease Providers:             Nathaneil Canary B. Lake Bells, MD, Benay Pillow, RN, [facility]                         Technician, Technician Referring MD:           Medicines:             General Anesthesia Complications:         No immediate complications Estimated Blood Loss:  Estimated blood loss was minimal. Procedure:             Pre-Anesthesia Assessment:                        - A History and Physical has been performed. Patient                         meds and allergies have been reviewed. The risks and                         benefits of the procedure and the sedation options and                         risks were discussed with the patient. All questions                         were answered and informed consent was obtained.                         Patient identification and proposed procedure were                         verified prior to the procedure by the physician and                         the nurse in the pre-procedure area. Mental Status                         Examination: normal. Airway Examination: normal                         oropharyngeal airway. Respiratory Examination: clear                         to auscultation. CV Examination: normal. ASA Grade                         Assessment: II - A patient with mild systemic disease.                         After reviewing the risks and benefits, the patient  was deemed in satisfactory condition to undergo the                         procedure. The anesthesia plan was to use general                         anesthesia. Immediately prior to administration of                          medications, the patient was re-assessed for adequacy                         to receive sedatives. The heart rate, respiratory                         rate, oxygen saturations, blood pressure, adequacy of                         pulmonary ventilation, and response to care were                         monitored throughout the procedure. The physical                         status of the patient was re-assessed after the                         procedure.                        After obtaining informed consent, the bronchoscope was                         passed under direct vision. Throughout the procedure,                         the patient's blood pressure, pulse, and oxygen                         saturations were monitored continuously. the [id number]                         ([id number]) Olympus broncoscope was introduced through                         the mouth, via the endotracheal tube and advanced to                         the tracheobronchial tree. The procedure was                         accomplished without difficulty. The patient tolerated                         the procedure well. The total duration of the                         procedure was 12 minutes. Scope In: Scope Out: Findings:      The endotracheal tube  is in good position. The visualized portion of the       trachea is of normal caliber. The carina is sharp. The tracheobronchial       tree was examined to at least the first subsegmental level. Bronchial       mucosa and anatomy are normal; there are no endobronchial lesions, and       no secretions.      The bronchoscope was advanced until wedged at the desired location for       bronchoalveolar lavage. BAL was performed in the RUL anterior segment       (B3) of the lung and sent for cell count, bacterial culture, viral       smears & culture, and fungal & AFB analysis and cytology. 60 mL of fluid       were instilled. 16 mL were returned. The return was  blood-tinged. There       were no mucoid plugs in the return fluid.      Transbronchial biopsies of an area of infiltration were performed in the       anterior segment of the right upper lobe using forceps and sent for       histopathology examination. The procedure was guided by fluoroscopy.       Transbronchial biopsy technique was selected because the sampling site       was not accessible using standard endoscopic (bronchoscopic) techniques.       Seven biopsy passes were performed. Seven biopsy samples were obtained. Impression:            - Interstitial lung disease                        - The airway examination was normal.                        - Bronchoalveolar lavage was performed.                        - Transbronchial lung biopsies were performed. Moderate Sedation:      General Anesthesia Recommendation:        - Await BAL, biopsy, culture and cytology results. Procedure Code(s):     --- Professional ---                        [phone], Bronchoscopy, rigid or flexible, including                         fluoroscopic guidance, when performed; with                         transbronchial lung biopsy(s), single lobe                        19147, Bronchoscopy, rigid or flexible, including                         fluoroscopic guidance, when performed; with bronchial                         alveolar lavage Diagnosis Code(s):     --- Professional ---  J84.9, Interstitial pulmonary disease, unspecified CPT copyright 2022 [employer]. All rights reserved. The codes documented in this report are preliminary and upon coder review may  be revised to meet current compliance requirements. Norlene Campbell, MD Juanito Doom, MD 05/26/2022 10:19:02 AM This report has been signed electronically. Number of Addenda: 0

## 2022-05-26 NOTE — Anesthesia Procedure Notes (Signed)
Procedure Name: Intubation Date/Time: 05/26/2022 9:50 AM  Performed by: Gwyndolyn Saxon, CRNAPre-anesthesia Checklist: Patient identified, Emergency Drugs available, Suction available and Patient being monitored Patient Re-evaluated:Patient Re-evaluated prior to induction Oxygen Delivery Method: Circle system utilized Preoxygenation: Pre-oxygenation with 100% oxygen Induction Type: IV induction Ventilation: Mask ventilation without difficulty Laryngoscope Size: Miller and 2 Grade View: Grade I Tube type: Oral Tube size: 8.0 mm Number of attempts: 1 Airway Equipment and Method: Stylet Placement Confirmation: ETT inserted through vocal cords under direct vision, positive ETCO2 and breath sounds checked- equal and bilateral Secured at: 21 cm Tube secured with: Tape Dental Injury: Teeth and Oropharynx as per pre-operative assessment

## 2022-05-26 NOTE — Transfer of Care (Signed)
Immediate Anesthesia Transfer of Care Note  Patient: Brenda Ayala  Procedure(s) Performed: VIDEO BRONCHOSCOPY WITH FLUORO BRONCHIAL BIOPSIES BRONCHIAL WASHINGS  Patient Location: PACU  Anesthesia Type:General  Level of Consciousness: awake and patient cooperative  Airway & Oxygen Therapy: Patient Spontanous Breathing  Post-op Assessment: Report given to RN and Post -op Vital signs reviewed and stable  Post vital signs: Reviewed and stable  Last Vitals:  Vitals Value Taken Time  BP 120/90 05/26/22 1025  Temp    Pulse 73 05/26/22 1028  Resp 21 05/26/22 1028  SpO2 92 % 05/26/22 1028  Vitals shown include unvalidated device data.  Last Pain:  Vitals:   05/26/22 0740  TempSrc:   PainSc: 0-No pain         Complications: No notable events documented.

## 2022-05-27 ENCOUNTER — Other Ambulatory Visit: Payer: Self-pay | Admitting: Pulmonary Disease

## 2022-05-27 DIAGNOSIS — J849 Interstitial pulmonary disease, unspecified: Secondary | ICD-10-CM

## 2022-05-27 LAB — SURGICAL PATHOLOGY

## 2022-05-28 ENCOUNTER — Other Ambulatory Visit: Payer: Self-pay | Admitting: Pulmonary Disease

## 2022-05-28 ENCOUNTER — Encounter (HOSPITAL_COMMUNITY): Payer: Self-pay | Admitting: Pulmonary Disease

## 2022-05-28 LAB — CYTOLOGY - NON PAP

## 2022-05-30 LAB — CULTURE, BAL-QUANTITATIVE W GRAM STAIN: Culture: NO GROWTH

## 2022-05-30 LAB — ACID FAST SMEAR (AFB, MYCOBACTERIA): Acid Fast Smear: NEGATIVE

## 2022-06-01 ENCOUNTER — Telehealth: Payer: Self-pay | Admitting: Pulmonary Disease

## 2022-06-01 LAB — AEROBIC/ANAEROBIC CULTURE W GRAM STAIN (SURGICAL/DEEP WOUND): Gram Stain: NONE SEEN

## 2022-06-01 NOTE — Telephone Encounter (Signed)
Called patient to review bronchoscopy results Left message

## 2022-06-02 ENCOUNTER — Telehealth: Payer: Self-pay | Admitting: Pulmonary Disease

## 2022-06-03 NOTE — Telephone Encounter (Signed)
Dr Lake Bells,  Patient is wanting to talk to you in regards to bronch results. When you have a chance can you call her again.  Thank you

## 2022-06-03 NOTE — Telephone Encounter (Signed)
Called again, call went straight to voicemail.

## 2022-06-03 NOTE — Telephone Encounter (Signed)
pt is returning call to [facility] for bronch results. please advise

## 2022-06-10 ENCOUNTER — Ambulatory Visit (INDEPENDENT_AMBULATORY_CARE_PROVIDER_SITE_OTHER): Payer: Managed Care, Other (non HMO) | Admitting: Pulmonary Disease

## 2022-06-10 ENCOUNTER — Encounter: Payer: Self-pay | Admitting: Pulmonary Disease

## 2022-06-10 VITALS — BP 136/84 | HR 63 | Ht 65.0 in | Wt 155.0 lb

## 2022-06-10 DIAGNOSIS — J849 Interstitial pulmonary disease, unspecified: Secondary | ICD-10-CM | POA: Diagnosis not present

## 2022-06-10 DIAGNOSIS — R053 Chronic cough: Secondary | ICD-10-CM

## 2022-06-10 DIAGNOSIS — R768 Other specified abnormal immunological findings in serum: Secondary | ICD-10-CM

## 2022-06-10 NOTE — Patient Instructions (Signed)
Chronic cough: As discussed today I am concerned you may have some degree of acid reflux Take over-the-counter Pepcid as directed for a month Avoid fatty foods, alcohol, chocolate, caffeine and tobacco products No eating within 3 hours of bedtime You need to try to suppress your cough to allow your larynx (voice box) to heal.  For three days don't talk, laugh, sing, or clear your throat. Do everything you can to suppress the cough during this time. Use hard candies (sugarless Jolly Ranchers) or non-mint or non-menthol containing cough drops during this time to soothe your throat.  Use a cough suppressant (Delsym or what I have prescribed you) around the clock during this time.  After three days, gradually increase the use of your voice and back off on the cough suppressants. If no improvement then we will consider treatment with something like Elavil  Diffuse parenchymal lung disease: As discussed today I think this is due to scarring from COVID We need to make sure its not worsening: PFT in 1 year, high-resolution CT scan in 1 year We will continue to follow-up the results from the bronchoscopy  We will see you back in 4 weeks to see how you are doing with the cough, sooner if needed

## 2022-06-10 NOTE — Addendum Note (Signed)
Addended by: Valerie Salts on: 06/10/2022 04:21 PM   Modules accepted: Orders

## 2022-06-10 NOTE — Progress Notes (Signed)
Synopsis: Referred in August 2023 for abnormal CT chest and chronic cough in the context of having had COVID in the year prior. She had a severe episode of aspiration pneumonia when she was a high schooler which required a hospitalization.  Follow-up high-resolution CT scan of the chest was not consistent with UIP.  Bronchoscopy with biopsy did not show evidence of eosinophilic or other inflammatory pneumonia.  Fibrotic changes noted  Subjective:   PATIENT ID: Brenda Ayala GENDER: female DOB: Dec 28, 1955, MRN: [id number]   HPI  Chief Complaint  Patient presents with   Follow-up    F/U biopsy results. States she has been coughing more since procedure.     [name] says that since the bronchoscopy she has had a persistent dry cough. Coughing all day  Tickle in her throat If she eats greens late in the day she gets indigestion Worse when she lay flat She coughs at night No asthma as a kid  She says that she has joint aches from time to time when the weather changes and when she is active.  She will sometimes get a rash on her ankles.  She says that she is never had a diagnosis of a connective tissue disease in the past.  She says she does not think she has left-sided reflux but she does have some indigestion sometimes.  She has never had heartburn.  She has no postnasal drip.    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  Her EF was mildly reduced to 50-55%   Pneumonia       Review of Systems  Constitutional:  Negative for fever, malaise/fatigue and weight loss.  HENT:  Negative for congestion, ear discharge, nosebleeds and sinus pain.   Respiratory:  Positive for cough. Negative for sputum production and  shortness of breath.   Cardiovascular:  Negative for orthopnea, claudication and leg swelling.      Objective:  Physical Exam   Vitals:   06/10/22 0841  BP: 136/84  Pulse: 63  SpO2: 97%  Weight: 155 lb (70.3 kg)  Height: '5\' 5"'$  (1.651 m)    Gen: well appearing HENT: OP clear, neck supple PULM: CTA B, normal percussion CV: RRR, no mgr, trace edema GI: BS+, soft, nontender Derm: no cyanosis or rash Psyche: normal mood and affect   CBC    Component Value Date/Time   WBC 7.4 05/26/2022 0756   RBC 4.38 05/26/2022 0756   HGB 13.9 05/26/2022 0756   HCT 41.1 05/26/2022 0756   PLT 334 05/26/2022 0756   MCV 93.8 05/26/2022 0756   MCH 31.7 05/26/2022 0756   MCHC 33.8 05/26/2022 0756   RDW 13.4 05/26/2022 0756     Chest imaging: November 2022 CT chest showing nonspecific fibrotic changes in the bases of her lungs 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 Rosario Jacks, in general findings worse in bases, hiatal hernia noted  Other imaging: October 2023 modified barium follow normal  PFT: May 04, 2022 ratio 86%, FVC  1.89 L 58% predicted, total lung capacity 3.61 L 69% predicted, DLCO 13.5 mL/mmHg 66% predicted  Labs: 05/2022 ANA positive 1:320, dsDNA negative, anti-Jo1 negative, scleroderma antibody negative, RNP negative, SSA/SSB negative, ANCA negative, hypersensitivity pneumonitis panel negative, aldolase negative, CK negative, CCP negative, rheumatoid factor negative  Path: October 2023 transbronchial biopsy right lower lobe showed benign bronchial wall and scant lung parenchyma with mild fibrosis, negative for granulomatous or increased eosinophils, negative for malignancy  Echo:  Heart Catheterization:       Assessment & Plan:   ILD (interstitial lung disease)  ([place])  Chronic cough  Discussion: [name] has scarring seen on the CT scan of her lungs which did not change over the course of 1 year and is most likely due to the COVID infection she had in 2022.  At this time there is no evidence of active inflammation or evidence of progression.  Serology testing did yield an elevated ANA without other signs and symptoms of an underlying connective tissue disease.  She does have some joint ache and rash I think it is worthwhile for her to see a rheumatologist to make sure that there is nothing more serious involved there.  Her chronic cough is most likely due to gastroesophageal reflux disease and perpetual laryngeal irritation.  Plan: Chronic cough: As discussed today I am concerned you may have some degree of acid reflux Take over-the-counter Pepcid as directed for a month Avoid fatty foods, alcohol, chocolate, caffeine and tobacco products No eating within 3 hours of bedtime You need to try to suppress your cough to allow your larynx (voice box) to heal.  For three days don't talk, laugh, sing, or clear your throat. Do everything you can to suppress the cough during this time. Use hard candies (sugarless Jolly Ranchers) or non-mint or non-menthol containing cough drops during this time to soothe your throat.  Use a cough suppressant (Delsym or what I have prescribed you) around the clock during this time.  After three days, gradually increase the use of your voice and back off on the cough suppressants. If no improvement then we will consider treatment with something like Elavil  Diffuse parenchymal lung disease: As discussed today I think this is due to scarring from COVID We need to make sure its not worsening: PFT in 1 year, high-resolution CT scan in 1 year We will continue to follow-up the results from the bronchoscopy  We will see you back in 4 weeks to see how you are doing with the cough, sooner if needed   Current Outpatient Medications:     amLODipine (NORVASC) 2.5 MG tablet, Take 2.5 mg by mouth daily., Disp: , Rfl:    benzonatate (TESSALON PERLES) 100 MG capsule, Take 2 capsules (200 mg total) by mouth 3 (three) times daily as needed for cough., Disp: 45 capsule, Rfl: 0   Cholecalciferol (VITAMIN D) 50 MCG (2000 UT) tablet, Take 2,000 Units by mouth daily., Disp: , Rfl:    losartan (COZAAR) 50 MG tablet, Take 25 mg by mouth 2 (two) times daily., Disp: , Rfl:    montelukast (SINGULAIR) 10 MG tablet, Take 10 mg by mouth daily., Disp: , Rfl:    Potassium Gluconate 595 MG CAPS, Take 595 mg by mouth daily., Disp: , Rfl:    rosuvastatin (CRESTOR) 20 MG tablet, Take 20 mg by mouth daily., Disp: , Rfl:    telmisartan (MICARDIS) 80 MG tablet, Take 80 mg by mouth daily., Disp: , Rfl:

## 2022-06-10 NOTE — Addendum Note (Signed)
Addended by: Valerie Salts on: 06/10/2022 10:19 AM   Modules accepted: Orders

## 2022-06-17 LAB — CULTURE, FUNGUS WITHOUT SMEAR

## 2022-06-21 LAB — MYCOBACTERIA,CULT W/FLUOROCHROME SMEAR
MICRO NUMBER:: 14000592
SMEAR:: NONE SEEN
SPECIMEN QUALITY:: ADEQUATE

## 2022-06-21 LAB — RESPIRATORY CULTURE OR RESPIRATORY AND SPUTUM CULTURE
MICRO NUMBER:: 14000593
RESULT:: NORMAL
SPECIMEN QUALITY:: ADEQUATE

## 2022-06-30 ENCOUNTER — Other Ambulatory Visit: Payer: Self-pay

## 2022-06-30 DIAGNOSIS — R768 Other specified abnormal immunological findings in serum: Secondary | ICD-10-CM

## 2022-07-07 ENCOUNTER — Ambulatory Visit: Payer: Managed Care, Other (non HMO) | Admitting: Pulmonary Disease

## 2022-07-13 ENCOUNTER — Ambulatory Visit (INDEPENDENT_AMBULATORY_CARE_PROVIDER_SITE_OTHER): Payer: Managed Care, Other (non HMO) | Admitting: Internal Medicine

## 2022-07-13 DIAGNOSIS — J849 Interstitial pulmonary disease, unspecified: Secondary | ICD-10-CM

## 2022-07-13 LAB — ACID FAST CULTURE WITH REFLEXED SENSITIVITIES (MYCOBACTERIA): Acid Fast Culture: NEGATIVE

## 2022-07-13 NOTE — Progress Notes (Unsigned)
Cardiology Office Note:    Date:  07/14/2022   ID:  Brenda Ayala, DOB September 30, 1955, MRN [id number]  PCP:  Wayland Salinas, Lane Cardiologist: Sanda Klein, MD   Reason for visit: Cardiac murmur?  History of Present Illness:    Brenda Ayala is a 66 y.o. female with a hx of HTN , LBBB, nonobstructive CAD, hypercholesterolemia and mild MR.  Coronary CT angiography performed April 2018 showed a 50% tubular narrowing in the first diagonal artery and mild plaque in the RCA and mid LAD, no significant stenosis. Her calcium score was 61st percentile for age and gender.    I saw her in March 2023.  She had some residual shortness of breath after COVID and pneumonia.  She walks a lot at work at [employer].  And she started a weight watchers program.  Today, she discusses her persistent shortness of breath and chronic cough.  She has had extensive testing including bronchoscopy and CT scans with pulmonology.  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  Her primary care recommended she come today to get a 2D echo to follow-up on history of mitral regurgitation to make sure that is not progressed and contributing to her shortness of breath.  She had mild MR on echo in 2017.  Other recent changes include her PCP changed her Lipitor to Crestor in November given myalgias.  She also changed her Micardis to losartan to see if that would improve her cough (no effect so far).    Patient says average blood pressure 120s to 130s over 60s to 70s.  She has brief sharp chest pain that is spontaneous, possibly related to stress.  She denies lightheadedness, syncope, LE edema, orthopnea and PND.  Her weight has been stable.  Her palpitations have resolved.    Past Medical History:  Diagnosis Date   COVID 2023   had pneumonia   Fracture of left foot    Fracture of left pelvis (HCC)    Heart murmur    Hyperlipidemia    Hypertension    LBBB (left bundle branch block)     Echo in 2009 at [facility] showed mild MR and interventricular septal dyssynergy due to the LBBB.  Her EF was mildly reduced to 50-55%   Pneumonia     Past Surgical History:  Procedure Laterality Date   BRONCHIAL BIOPSY  05/26/2022   Procedure: BRONCHIAL BIOPSIES;  Surgeon: Juanito Doom, MD;  Location[facility] ENDOSCOPY;  Service: Cardiopulmonary;;   BRONCHIAL WASHINGS  05/26/2022   Procedure: BRONCHIAL WASHINGS;  Surgeon: Juanito Doom, MD;  Location: [facility] ENDOSCOPY;  Service: Cardiopulmonary;;   COLONOSCOPY  07/02/2005   normal   PIP JOINT FUSION Left    steel rod left foot    ROTATOR CUFF REPAIR Bilateral    2 different surgeries  2006 and 2014   VIDEO BRONCHOSCOPY N/A 05/26/2022   Procedure: VIDEO BRONCHOSCOPY WITH FLUORO;  Surgeon: Juanito Doom, MD;  Location: [facility];  Service: Cardiopulmonary;  Laterality: N/A;    Current Medications: Current Meds  Medication Sig   amLODipine (NORVASC) 2.5 MG tablet Take 2.5 mg by mouth daily.   Cholecalciferol (VITAMIN D) 50 MCG (2000 UT) tablet Take 2,000 Units by mouth daily.   losartan (COZAAR) 50 MG tablet Take 25 mg by mouth 2 (two) times daily.   montelukast (SINGULAIR) 10 MG tablet Take 10 mg by mouth daily.   Potassium Gluconate 595 MG CAPS Take 595 mg by  mouth daily.   rosuvastatin (CRESTOR) 20 MG tablet Take 20 mg by mouth daily.     Allergies:   Latex and Sulfa antibiotics   Social History   Socioeconomic History   Marital status: Married    Spouse name: Not on file   Number of children: Not on file   Years of education: Not on file   Highest education level: Not on file  Occupational History   Not on file  Tobacco Use   Smoking status: Never   Smokeless tobacco: Never  Vaping Use   Vaping Use: Never used  Substance and Sexual Activity   Alcohol use: Yes    Alcohol/week: 0.0 standard drinks of alcohol    Comment: drinks wine occasionally--3 times a week    Drug use: No   Sexual activity:  Yes    Birth control/protection: Post-menopausal  Other Topics Concern   Not on file  Social History Narrative   Marital Status: Married Copy)   Children: Sons (2); Daughter (1)     Pets: None   Living Situation: Lives with husband    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 There is no history of Colon cancer, Colon polyps, Esophageal cancer, Rectal cancer, or Stomach cancer.  ROS:   Please see the history of present illness.     EKGs/Labs/Other Studies Reviewed:    EKG:  The ekg ordered today demonstrates normal sinus rhythm, heart rate 60, left axis deviation, chronic left bundle branch block.  Recent Labs: 05/26/2022: BUN 16; Creatinine, Ser 0.69; Hemoglobin 13.9; Platelets 334; Potassium 3.8; Sodium 134   Recent Lipid Panel Lab Results  Component Value Date/Time   CHOL 258 (H) 08/12/2020 10:03 AM   TRIG 143 08/12/2020 10:03 AM   HDL 75 08/12/2020 10:03 AM   LDLCALC 158 (H) 08/12/2020 10:03 AM    Physical Exam:    VS:  BP 132/70   Pulse 60   Ht '5\' 4"'$  (1.626 m)   Wt 157 lb 9.6 oz (71.5 kg)   SpO2 95%   BMI 27.05 kg/m    No data found.       Wt Readings from Last 3 Encounters:  07/14/22 157 lb 9.6 oz (71.5 kg)  06/10/22 155 lb (70.3 kg)  05/26/22 157 lb (71.2 kg)     GEN:  Well nourished, well developed in no acute distress HEENT: Normal NECK: No JVD; No carotid bruits CARDIAC: Systolic murmur,  RRR RESPIRATORY:  Clear to auscultation without rales, wheezing or rhonchi  ABDOMEN: Soft, non-tender, non-distended MUSCULOSKELETAL: No edema SKIN: Warm and dry NEUROLOGIC:  Alert and oriented PSYCHIATRIC:  Normal affect     ASSESSMENT AND PLAN   Mitral regurgitation -Mild by echo in 2017 -With shortness of breath, will reevaluate with a 2D echo. -Patient is euvolemic.  Coronary artery disease with no angina -  Coronary CT angiography April 2018 showed a 50% tubular narrowing in the first diagonal artery and mild plaque in the RCA and mid LAD, no significant stenosis. Her calcium score was 61st percentile for age and gender.  -EKG without ischemic changes. -Continue statin therapy.   Hypertension, well-controlled -Continue current medications. -Goal BP is <130/80.  Recommend DASH diet (high in vegetables, fruits, low-fat dairy products, whole grains, poultry, fish, and nuts and low in sweets, sugar-sweetened beverages, and red meats), salt restriction and increase physical activity.   Hyperlipidemia -LDL had improved from 158 in 08/2020 to 81 in 06/2021 after taking Lipitor regularly.   -Switched from Lipitor to Crestor in November by PCP 2/2 myalgias. -She scheduled to recheck lipids with her PCP in January. -Discussed cholesterol lowering diets - Mediterranean diet, DASH diet, vegetarian diet, low-carbohydrate diet and avoidance of trans fats.  Discussed healthier choice substitutes.  Nuts, high-fiber foods, and fiber supplements may also improve lipids.     Palpitations, resolved -Likely secondary to COVID.    Disposition - Follow-up in months with Dr. Sallyanne Kuster.   Medication Adjustments/Labs and Tests Ordered: Current medicines are reviewed at length with the patient today.  Concerns regarding medicines are outlined above.  Orders Placed This Encounter  Procedures   EKG 12-Lead   ECHOCARDIOGRAM COMPLETE   No orders of the defined types were placed in this  encounter.   Patient Instructions  Medication Instructions:  Your physician recommends that you continue on your current medications as directed. Please refer to the Current Medication list given to you today.  *If you need a refill on your cardiac medications before your next appointment, please call your pharmacy*   Testing/Procedures: Your physician has requested that you have an echocardiogram. Echocardiography is a painless test that uses sound waves to create images of your heart. It provides your doctor with information about the size and shape of your heart and how well your heart's chambers and valves are working. This procedure takes approximately one hour. There are no restrictions for this procedure. Please do NOT wear cologne, perfume, aftershave, or lotions (deodorant is allowed). Please arrive 15 minutes prior to your appointment time. This procedure will be done at [address] [place]. [address]    Follow-Up: At [facility], you and your health needs are our priority.  As part of our continuing mission to provide you with exceptional heart care, we have created designated Provider Care Teams.  These Care Teams include your primary Cardiologist (physician) and Advanced Practice Providers (APPs -  Physician Assistants and Nurse Practitioners) who all work together to provide you with the care you need, when you need it.  We recommend signing up for the patient portal called "MyChart".  Sign up information is provided on this After Visit Summary.  MyChart is used to connect with patients for Virtual Visits (Telemedicine).  Patients are able to view lab/test results, encounter notes, upcoming appointments, etc.  Non-urgent messages can be sent to your provider as well.   To learn more about what you can do with MyChart, go to [email or web address].    Your next appointment:   6 month(s)  The format for your next appointment:   In Person  Provider:   Sanda Klein,  MD      Signed, Warren Lacy, PA-C  07/14/2022 9:06 AM    Mobile

## 2022-07-14 ENCOUNTER — Encounter: Payer: Self-pay | Admitting: Physician Assistant

## 2022-07-14 ENCOUNTER — Ambulatory Visit: Payer: Managed Care, Other (non HMO) | Attending: Physician Assistant | Admitting: Physician Assistant

## 2022-07-14 VITALS — BP 132/70 | HR 60 | Ht 64.0 in | Wt 157.6 lb

## 2022-07-14 DIAGNOSIS — I340 Nonrheumatic mitral (valve) insufficiency: Secondary | ICD-10-CM | POA: Diagnosis not present

## 2022-07-14 DIAGNOSIS — I10 Essential (primary) hypertension: Secondary | ICD-10-CM | POA: Diagnosis not present

## 2022-07-14 DIAGNOSIS — R011 Cardiac murmur, unspecified: Secondary | ICD-10-CM

## 2022-07-14 DIAGNOSIS — E7800 Pure hypercholesterolemia, unspecified: Secondary | ICD-10-CM

## 2022-07-14 DIAGNOSIS — I2510 Atherosclerotic heart disease of native coronary artery without angina pectoris: Secondary | ICD-10-CM | POA: Diagnosis not present

## 2022-07-14 DIAGNOSIS — R0602 Shortness of breath: Secondary | ICD-10-CM

## 2022-07-14 DIAGNOSIS — R002 Palpitations: Secondary | ICD-10-CM

## 2022-07-14 NOTE — Progress Notes (Signed)
Interstitial Lung Disease Multidisciplinary Conference   Brenda Ayala    MRN [id number]    DOB June 02, 1956  Primary Care Physician:Ryter-Brown, Shyrl Numbers, MD  Referring Physician: Dr Lake Bells  Time of Conference: 7.30am- 8.30am Date of conference: 07/13/22 Location of Conference: -  Virtual  Participating Pulmonary: Dr. Brand Males, MD - yes,  Dr Marshell Garfinkel, MD - yes Pathology: Dr Jaquita Folds, MD - yes ,  Radiology: Dr Salvatore Marvel MD - yes,  Others: dr Lake Bells, Dr Madie Reno, Derl Barrow and Dr Franchot Heidelberg  Brief History: "66 y/o female life long non-smoker seen in clinic for 5 years of cough.  Had [place] in 12/2020 and cough went from dry to productive of mucus, associated with dyspnea.  Dyspnea improved but cough remained.  Cough improved with azithomycin. 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 Had aspiration pneumonia when she was a teenager.  Notices abdominal fullness and reflux from time to time. Planning bronchoscopy with BAL for cell count, transbronchial biopsy and culture. Sending labs this visit. Would like to go over labs, path and imaging results with the team and discuss: is this post COVID inflammatory changes? recurrent aspiration?  NSIP?"    MDD discussion of CT scan    - Date or time period of scan: "HRCT: 04/09/2022 : there is retriculation ., mild traction bronchiectsia. No gradient. No honeycombin. There is moderate air trapping. There is progression overtime. ATS: Alternate Diagnosis.  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 Transbronchial biopsy: non-diagnostic. No cell count. "v - What is the final conclusion per 2018 ATS/Fleischner Criteria - NOT c/w UIP. C/w Alternate Diagnosis. PROGRESSIVE PHENOTYPE  Pathology discussion of biopsy - transbronchial biopsy non diagnostic:  PFTs:      Latest Ref Rng & Units 05/04/2022    8:56 AM  PFT Results  FVC-Pre L 1.93   FVC-Predicted Pre % 59   FVC-Post L 1.89   FVC-Predicted Post % 58   Pre FEV1/FVC % % 87   Post FEV1/FCV % % 86   FEV1-Pre L 1.67   FEV1-Predicted Pre % 67   FEV1-Post L 1.63   DLCO uncorrected ml/min/mmHg 13.54   DLCO UNC% % 66   DLCO corrected ml/min/mmHg 13.50   DLCO COR %Predicted % 66   DLVA Predicted % 94   TLC L 3.61   TLC % Predicted % 69   RV % Predicted % 68      Latest Reference Range & Units 02/06/13 07:56 11/05/16 08:37 05/04/22 08:54 05/04/22 11:23 05/24/22 00:00 05/26/22 07:56  Sed Rate 0 - 30 mm/hr    36 (H)    Glucose 70 - 99 mg/dL 96 92    105 (H)  Source    SPUTUM SPUTUM     STATUS:    FINAL FINAL     Anti Nuclear Antibody (ANA) NEGATIVE     POSITIVE !    ANA Pattern 1     Nuclear, Homogeneous !    ANA Titer 1 titer    1:320 (H)    ANCA SCREEN Negative     Negative    Anti JO-1 0.0 - 0.9 AI    <0.2    CENTROMERE AB SCREEN <1.0 NEG AI    <8.2 NEG    Cyclic Citrullin Peptide Ab UNITS    <16    ds DNA Ab IU/mL  1    ENA RNP Ab 0.0 - 0.9 AI    <0.2    RA Latex Turbid. <14 IU/mL    <14    ENA SM Ab Ser-aCnc <1.0 NEG AI    <1.0 NEG    SSA (Ro) (ENA) Antibody, IgG <1.0 NEG AI    <1.0 NEG    SSB (La) (ENA) Antibody, IgG <1.0 NEG AI    <1.0 NEG    Scleroderma (Scl-70) (ENA) Antibody, IgG <1.0 NEG AI    <1.0 NEG    A.Fumigatus #1 Abs Negative     Negative    Micropolyspora faeni, IgG Negative     Negative    Thermoactinomyces vulgaris, IgG Negative     Negative    A. Pullulans Abs Negative     Negative    Thermoact. Saccharii Negative     Negative    [place] Serum Abs Negative     Negative    NOVEL CORONAVIRUS, NAA      Rpt   CENTROMERE ANTIBODIES     Rpt    (H): Data is abnormally high !: Data is abnormal Rpt: View report in Results Review for more information  MDD Impression/Recs: 1) Progressive phenotype  - ANA positive -> consider rheum consult; 2) strongly consider  antifibrotic (higher evidence for nintedanib) ; c) consider participation in ILD-PRO registry and discussion on clinical trial as a care option; d) Surgical biopsy - based on shared decision making, if specific etiology needed or immune modulator requirement   Time Spent in preparation and discussion:  > 30 min    SIGNATURE   Dr. Brand Males, M.D., F.C.C.P,  Pulmonary and Critical Care Medicine Staff Physician, [place] Director - Interstitial Lung Disease  Program  Pulmonary [place] [facility] [place], [place], [address]  Pager: [phone], If no answer or between  15:00h - 7:00h: call [phone] Telephone: [phone]  9:03 AM 07/14/2022 ...................................................................................................................Marland Kitchen References: Diagnosis of Hypersensitivity Pneumonitis in Adults. An Official ATS/JRS/ALAT Clinical Practice Guideline. Ragu G et al, [place] Aug 1;202(3):e36-e69.       Diagnosis of Idiopathic Pulmonary Fibrosis. An Official ATS/ERS/JRS/ALAT Clinical Practice Guideline. Raghu G et al, [place]. 2018 Sep 1;198(5):e44-e68.   IPF Suspected   Histopath ology Pattern      UIP  Probable UIP  Indeterminate for  UIP  Alternative  diagnosis    UIP  IPF  IPF  IPF  Non-IPF dx   HRCT   Probabe UIP  IPF  IPF  IPF (Likely)**  Non-IPF dx  Pattern  Indeterminate for UIP  IPF  IPF (Likely)**  Indeterminate  for IPF**  Non-IPF dx    Alternative diagnosis  IPF (Likely)**/ non-IPF dx  Non-IPF dx  Non-IPF dx  Non-IPF dx     Idiopathic pulmonary fibrosis diagnosis based upon HRCT and Biopsy paterns.  ** IPF is the likely diagnosis when any of following features are present:  Moderate-to-severe traction bronchiectasis/bronchiolectasis (defined as mild traction bronchiectasis/bronchiolectasis in four or more  lobes including the lingual as a lobe, or moderate to severe traction bronchiectasis in two or more lobes) in a man over age 36 years or in a woman over age 1 years Extensive (>30%) reticulation on HRCT and an age >70 years  Increased neutrophils and/or absence of lymphocytosis in BAL fluid  Multidisciplinary discussion reaches a confident diagnosis of IPF.   **  Indeterminate for IPF  Without an adequate biopsy is unlikely to be IPF  With an adequate biopsy may be reclassified to a more specific diagnosis after multidisciplinary discussion and/or additional consultation.   dx = diagnosis; HRCT = high-resolution computed tomography; IPF = idiopathic pulmonary fibrosis; UIP = usual interstitial pneumonia.

## 2022-07-14 NOTE — Patient Instructions (Signed)
Medication Instructions:  Your physician recommends that you continue on your current medications as directed. Please refer to the Current Medication list given to you today.  *If you need a refill on your cardiac medications before your next appointment, please call your pharmacy*   Testing/Procedures: Your physician has requested that you have an echocardiogram. Echocardiography is a painless test that uses sound waves to create images of your heart. It provides your doctor with information about the size and shape of your heart and how well your heart's chambers and valves are working. This procedure takes approximately one hour. There are no restrictions for this procedure. Please do NOT wear cologne, perfume, aftershave, or lotions (deodorant is allowed). Please arrive 15 minutes prior to your appointment time. This procedure will be done at [address] [place]. [address]    Follow-Up: At [facility], you and your health needs are our priority.  As part of our continuing mission to provide you with exceptional heart care, we have created designated Provider Care Teams.  These Care Teams include your primary Cardiologist (physician) and Advanced Practice Providers (APPs -  Physician Assistants and Nurse Practitioners) who all work together to provide you with the care you need, when you need it.  We recommend signing up for the patient portal called "MyChart".  Sign up information is provided on this After Visit Summary.  MyChart is used to connect with patients for Virtual Visits (Telemedicine).  Patients are able to view lab/test results, encounter notes, upcoming appointments, etc.  Non-urgent messages can be sent to your provider as well.   To learn more about what you can do with MyChart, go to [email or web address].    Your next appointment:   6 month(s)  The format for your next appointment:   In Person  Provider:   Sanda Klein, MD

## 2022-07-19 ENCOUNTER — Ambulatory Visit (INDEPENDENT_AMBULATORY_CARE_PROVIDER_SITE_OTHER): Payer: Managed Care, Other (non HMO) | Admitting: Pulmonary Disease

## 2022-07-19 ENCOUNTER — Encounter: Payer: Self-pay | Admitting: Pulmonary Disease

## 2022-07-19 VITALS — BP 128/82 | HR 62 | Ht 64.0 in | Wt 155.0 lb

## 2022-07-19 DIAGNOSIS — J849 Interstitial pulmonary disease, unspecified: Secondary | ICD-10-CM

## 2022-07-19 DIAGNOSIS — Z23 Encounter for immunization: Secondary | ICD-10-CM

## 2022-07-19 DIAGNOSIS — J479 Bronchiectasis, uncomplicated: Secondary | ICD-10-CM | POA: Diagnosis not present

## 2022-07-19 DIAGNOSIS — R768 Other specified abnormal immunological findings in serum: Secondary | ICD-10-CM

## 2022-07-19 MED ORDER — HYDROCOD POLI-CHLORPHE POLI ER 10-8 MG/5ML PO SUER: 5.0000 mL | Freq: Two times a day (BID) | ORAL | 0 refills | Status: DC | PRN

## 2022-07-19 NOTE — Addendum Note (Signed)
Addended by: Valerie Salts on: 07/19/2022 09:41 AM   Modules accepted: Orders

## 2022-07-19 NOTE — Patient Instructions (Signed)
Diffuse parenchymal lung disease: Most likely due to COVID-19 but there is some evidence of progression I will refer you to Dr. Wynn Maudlin at [facility] We will repeat a pulmonary function test in April We will repeat a high-resolution CT scan of your chest in April  Evidence of pulmonary artery dilation on CT scanning of the chest: Echocardiogram pending, I will follow-up those results  Chronic cough due to underlying diffuse parenchymal lung disease: Continue Delsym and hard candies during the daytime I will prescribe Tussionex to use sparingly at night when the cough is more severe.  This contains a narcotic.  Do not take this with any other sedatives.  I recommend that she use it exceedingly sparingly. Do not take this and drive, or make significant decisions after  We will see you back in April after the PFT and HRCT

## 2022-07-19 NOTE — Progress Notes (Signed)
Synopsis: Referred in August 2023 for abnormal CT chest and chronic cough in the context of having had COVID in the year prior. She had a severe episode of aspiration pneumonia when she was a high schooler which required a hospitalization.  Follow-up high-resolution CT scan of the chest was not consistent with UIP.  Bronchoscopy with biopsy did not show evidence of eosinophilic or other inflammatory pneumonia.  Fibrotic changes noted in bases.   Subjective:   PATIENT ID: Brenda Ayala GENDER: female DOB: 1956/03/19, MRN: [id number]   HPI  Chief Complaint  Patient presents with   Follow-up    4wk f/u. States she still has the cough. Cough is only productive during the morning. Denies any changes with SOB.    Still coughing every morning Has dyspnea on exertion No fever, no chills She saw Dr. Amil Amen, no evidence of underlying connective tissue disease She says that hard candies and Delsym seems to help with the cough but the more she talks the more it makes it worse.  She still has frequent episodes of cough which wake her up at night make it difficult for her to sleep. She has mild shortness of breath when she climbs up stairs.  She was discussed in our interdisciplinary interstitial lung disease conference.    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  Her EF was mildly reduced to 50-55%   Pneumonia       Review of Systems  Constitutional:  Negative for fever, malaise/fatigue and weight loss.  HENT:  Negative for congestion, ear discharge, nosebleeds and sinus pain.   Respiratory:  Positive for cough. Negative for sputum production and shortness of breath.   Cardiovascular:  Negative for orthopnea,  claudication and leg swelling.      Objective:  Physical Exam   Vitals:   07/19/22 0844  BP: 128/82  Pulse: 62  SpO2: 97%  Weight: 155 lb (70.3 kg)  Height: '5\' 4"'$  (1.626 m)    Gen: well appearing HENT: OP clear, neck supple PULM: CTA B, normal effort  CV: RRR, no mgr GI: BS+, soft, nontender Derm: no cyanosis or rash Psyche: normal mood and affect     CBC    Component Value Date/Time   WBC 7.4 05/26/2022 0756   RBC 4.38 05/26/2022 0756   HGB 13.9 05/26/2022 0756   HCT 41.1 05/26/2022 0756   PLT 334 05/26/2022 0756   MCV 93.8 05/26/2022 0756   MCH 31.7 05/26/2022 0756   MCHC 33.8 05/26/2022 0756   RDW 13.4 05/26/2022 0756     Chest imaging: November 2022 CT chest showing nonspecific fibrotic changes in the bases of her lungs felt to be probable UIP September 2023 HRCT images personally reviewed showing traction bronchiectasis, non-specific ground glass opacifications scattered bilaterally in a somewhat random pattern, felt to be indeterminant for UIP by Dr. Rosario Jacks, in general findings worse in bases, hiatal hernia noted  Other imaging: October 2023 modified barium follow normal  PFT: May 04, 2022 ratio 86%, FVC 1.89 L 58% predicted, total lung capacity 3.61 L 69% predicted, DLCO 13.5 mL/mmHg 66% predicted  Labs: 05/2022 ANA positive 1:320, dsDNA  negative, anti-Jo1 negative, scleroderma antibody negative, RNP negative, SSA/SSB negative, ANCA negative, hypersensitivity pneumonitis panel negative, aldolase negative, CK negative, CCP negative, rheumatoid factor negative  Path: October 2023 transbronchial biopsy right lower lobe showed benign bronchial wall and scant lung parenchyma with mild fibrosis, negative for granulomatous or increased eosinophils, negative for malignancy  Echo:  Heart Catheterization:       Assessment & Plan:   ILD (interstitial lung disease) (HCC)  Elevated antinuclear antibody (ANA) level  Bronchiolectasis (HCC)  Need  for immunization against influenza - Plan: Flu Vaccine QUAD High Dose(Fluad)  Discussion: 66 y/o female with poorly understood diffuse parenchymal lung abnormalities leading to persistent cough and mild dyspnea when she exerts herself.  She saw rheumatology and they see no evidence of an underlying rheumatologic illness.  Her bronchoscopy showed no findings of organizing pneumonia, eosinophilic pneumonia or an atypical lung infection.  In our interdisciplinary interstitial lung disease conference her September 2023 CT scan was interpreted as having some evidence of progression in the lingula by Dr. Polly Cobia.  We talked about the significance of progression phenotypes of underlying interstitial lung disease and the fact that there is some recommendations these days to use an antifibrotic medicine like nintedanib.  After discussing the data behind this, the risks benefits and costs she would prefer to hold off until she can get another opinion.  We reviewed all the recommendations from the ILD conference.  At this time she would like to seek another opinion prior to considering anti-fibrotic treatment.    Plan: Diffuse parenchymal lung disease: Most likely due to COVID-19 but there is some evidence of progression I will refer you to Dr. Wynn Maudlin at [facility] interstitial lung disease clinic We will repeat a pulmonary function test in April We will repeat a high-resolution CT scan of your chest in April  Evidence of pulmonary artery dilation on CT scanning of the chest: Echocardiogram pending, I will follow-up those results  Chronic cough due to underlying diffuse parenchymal lung disease: Continue Delsym and hard candies during the daytime I will prescribe Tussionex to use sparingly at night when the cough is more severe.  This contains a narcotic.  Do not take this with any other sedatives.  I recommend that she use it exceedingly sparingly. Do not take this and drive, or  make significant decisions after  High dose flu shot today  We will see you back in April after the PFT and HRCT   Current Outpatient Medications:    amLODipine (NORVASC) 2.5 MG tablet, Take 2.5 mg by mouth daily., Disp: , Rfl:    chlorpheniramine-HYDROcodone (TUSSIONEX) 10-8 MG/5ML, Take 5 mLs by mouth every 12 (twelve) hours as needed for cough., Disp: 145 mL, Rfl: 0   Cholecalciferol (VITAMIN D) 50 MCG (2000 UT) tablet, Take 2,000 Units by mouth daily., Disp: , Rfl:    losartan (COZAAR) 50 MG tablet, Take 25 mg by mouth 2 (two) times daily., Disp: , Rfl:    montelukast (SINGULAIR) 10 MG tablet, Take 10 mg by mouth daily., Disp: , Rfl:    Potassium Gluconate 595 MG CAPS, Take 595 mg by mouth daily., Disp: , Rfl:    rosuvastatin (CRESTOR) 20 MG tablet, Take 20 mg by mouth daily., Disp: , Rfl:

## 2022-07-29 ENCOUNTER — Ambulatory Visit (HOSPITAL_COMMUNITY)
Admission: RE | Admit: 2022-07-29 | Discharge: 2022-07-29 | Disposition: A | Discharge status: Home / Self Care | Payer: Managed Care, Other (non HMO) | Source: Ambulatory Visit | Attending: Physician Assistant | Admitting: Physician Assistant

## 2022-07-29 DIAGNOSIS — R0602 Shortness of breath: Secondary | ICD-10-CM | POA: Diagnosis present

## 2022-07-29 DIAGNOSIS — I340 Nonrheumatic mitral (valve) insufficiency: Secondary | ICD-10-CM

## 2022-07-29 DIAGNOSIS — R0609 Other forms of dyspnea: Secondary | ICD-10-CM | POA: Diagnosis not present

## 2022-07-29 DIAGNOSIS — Z8616 Personal history of COVID-19: Secondary | ICD-10-CM | POA: Diagnosis not present

## 2022-07-29 DIAGNOSIS — E785 Hyperlipidemia, unspecified: Secondary | ICD-10-CM | POA: Insufficient documentation

## 2022-07-29 DIAGNOSIS — I10 Essential (primary) hypertension: Secondary | ICD-10-CM | POA: Diagnosis not present

## 2022-07-29 DIAGNOSIS — I2510 Atherosclerotic heart disease of native coronary artery without angina pectoris: Secondary | ICD-10-CM | POA: Diagnosis not present

## 2022-07-29 DIAGNOSIS — I447 Left bundle-branch block, unspecified: Secondary | ICD-10-CM | POA: Diagnosis not present

## 2022-07-29 LAB — ECHOCARDIOGRAM COMPLETE
Area-P 1/2: 3.42 cm2
Calc EF: 67 %
MV VTI: 1.72 cm2
S' Lateral: 2.6 cm
Single Plane A2C EF: 65.1 %
Single Plane A4C EF: 68 %

## 2022-07-29 NOTE — Progress Notes (Signed)
  Echocardiogram 2D Echocardiogram has been performed.  Eartha Inch 07/29/2022, 9:07 AM

## 2022-08-04 ENCOUNTER — Telehealth: Payer: Self-pay

## 2022-08-04 NOTE — Telephone Encounter (Addendum)
Called patient regarding results. Left detailed message for patient regarding results. Letter mailed to patient.----- Message from Warren Lacy, PA-C sent at 08/03/2022  2:54 PM EST ----- Normal heart squeeze with EF 65-70%.  Mitral regurgitation remains mild -stable from 2017. No significant findings to explain shortness of breath.

## 2022-09-16 IMAGING — CT CT FOOT*L* W/O CM
3 series · 9 of 33 positions shown, 11 images · non-contrast
Comparison: None.

CLINICAL DATA: Nondisplaced fifth metatarsal fracture. Injured
03/07/2021

EXAM:
CT OF THE LEFT FOOT WITHOUT CONTRAST
TECHNIQUE: Multidetector CT imaging of the left foot was performed according to
the standard protocol. Multiplanar CT image reconstructions were
also generated.

[Series 5: sfov lower extremity 2.00 br40 s3 soft · axial · 0.27mm/px · z∈[+567,+567]mm · 1 of 61 slices shown, 2 images (1 of 3)]
[im 33/61  soft-tissue]
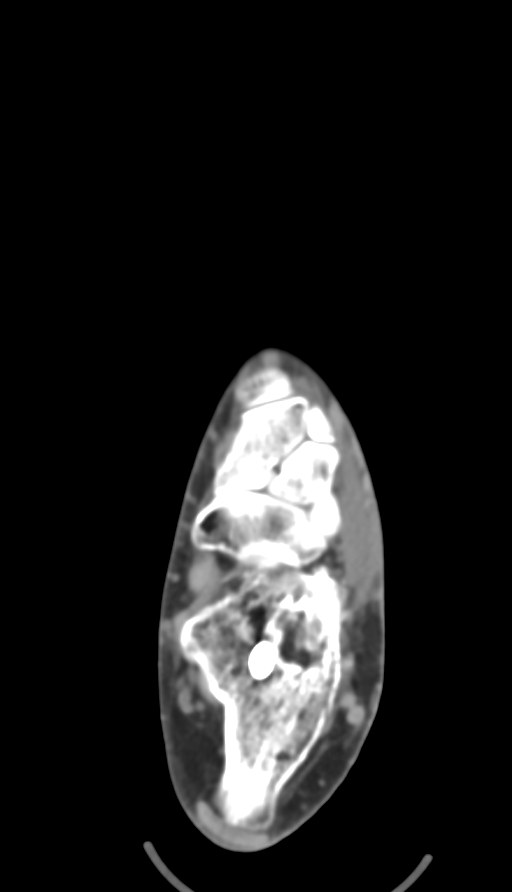
[im 33/61  bone]
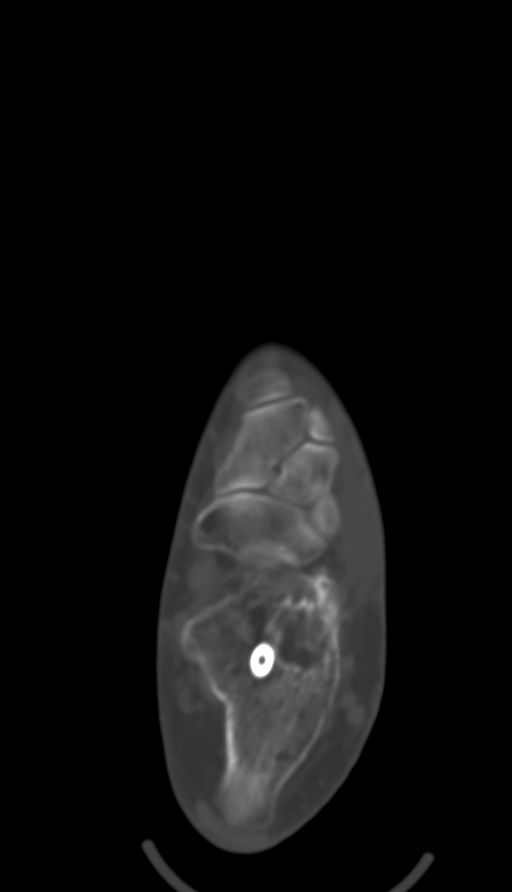

[Series 9: sfov lower extremity 2.00 br40 s3 soft · coronal · 0.24mm/px · 3 of 125 slices shown (2 of 3)]
[im 25/125  bone]
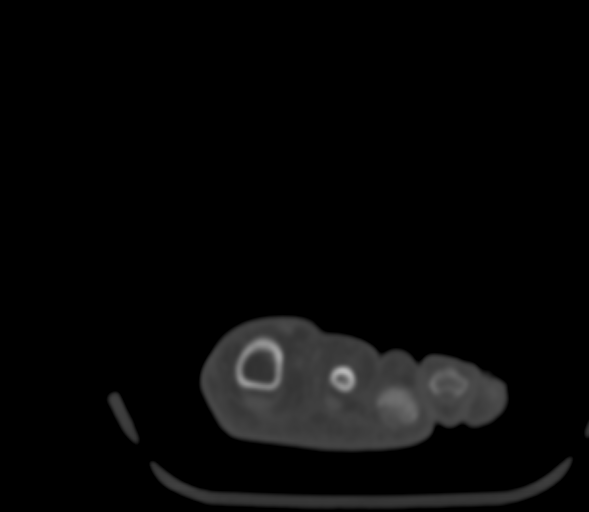
[im 50/125  bone]
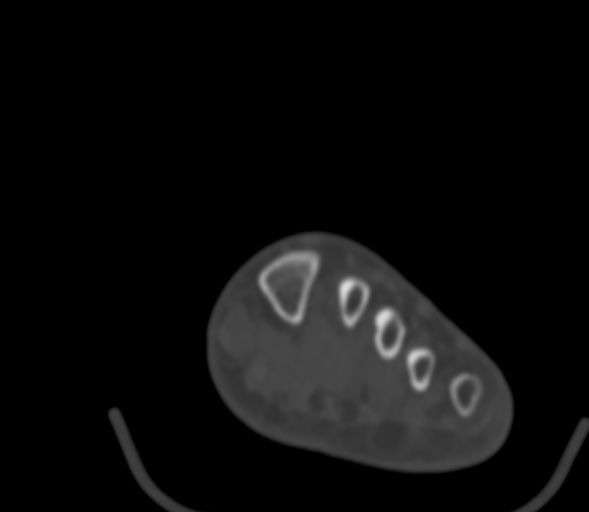
[im 75/125  bone]
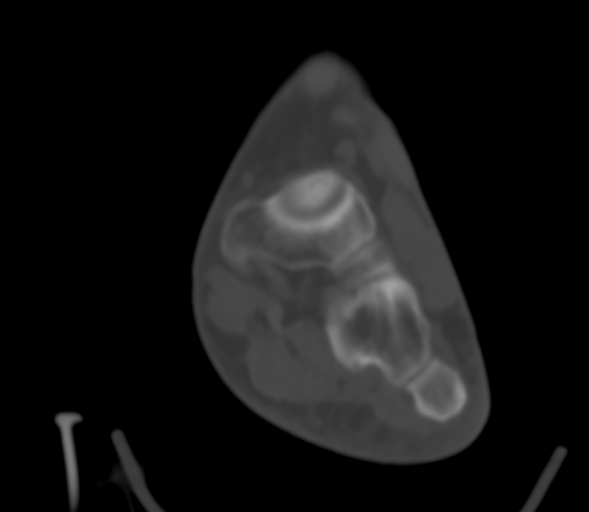

[Series 13: sfov lower extremity 2.00 br40 s3 soft · sagittal · 0.24mm/px · 5 of 70 slices shown, 6 images (3 of 3)]
[im 24/70  bone]
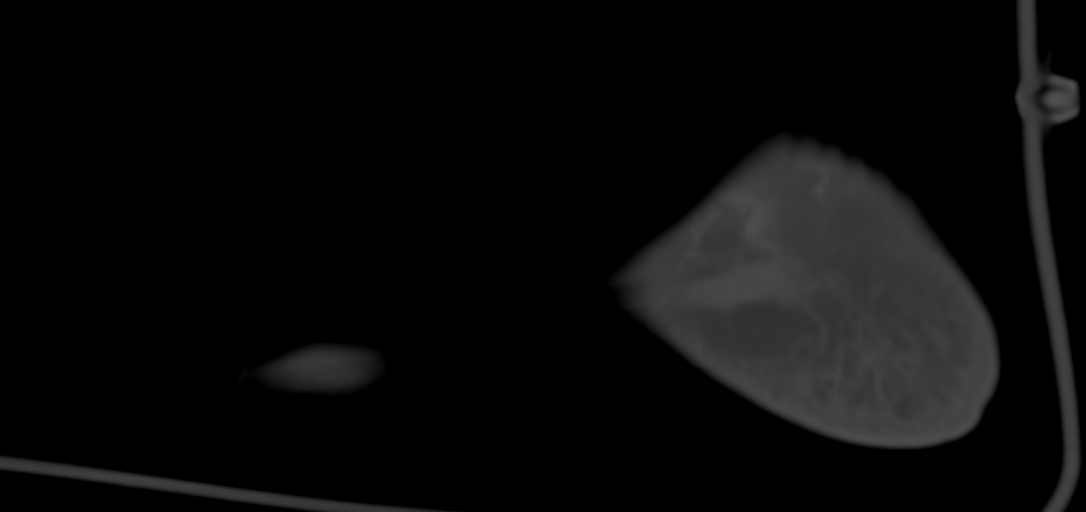
[im 29/70  bone]
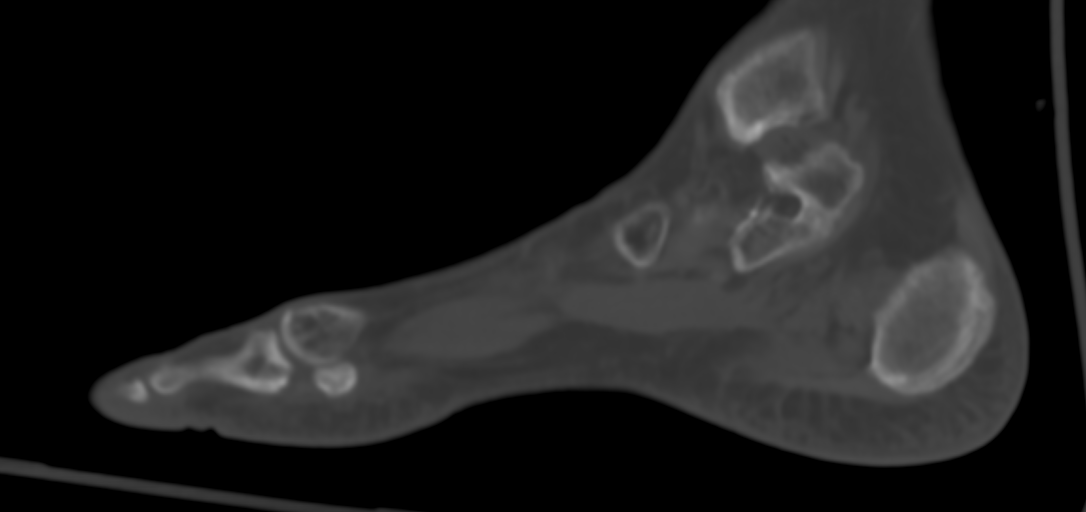
[im 35/70  soft-tissue]
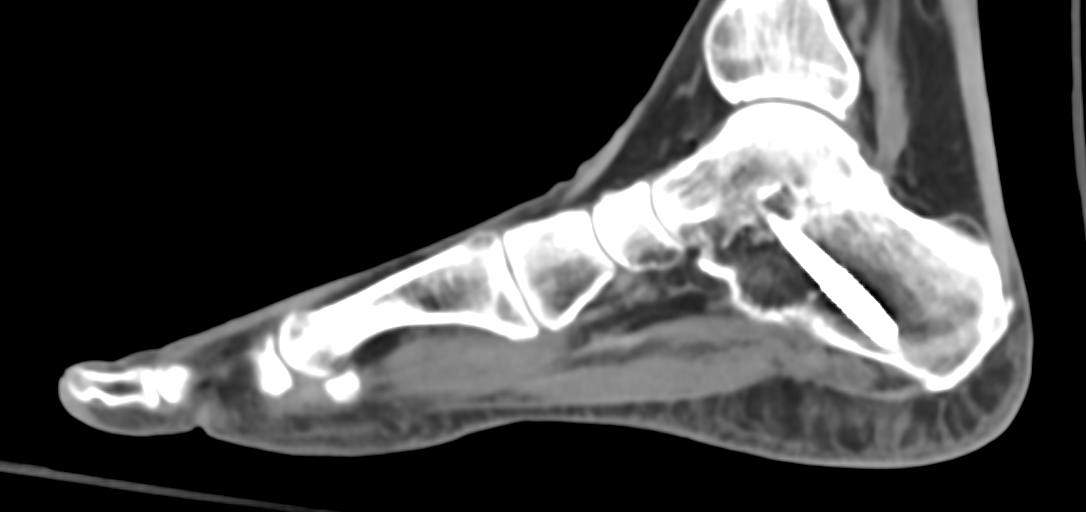
[im 35/70  bone]
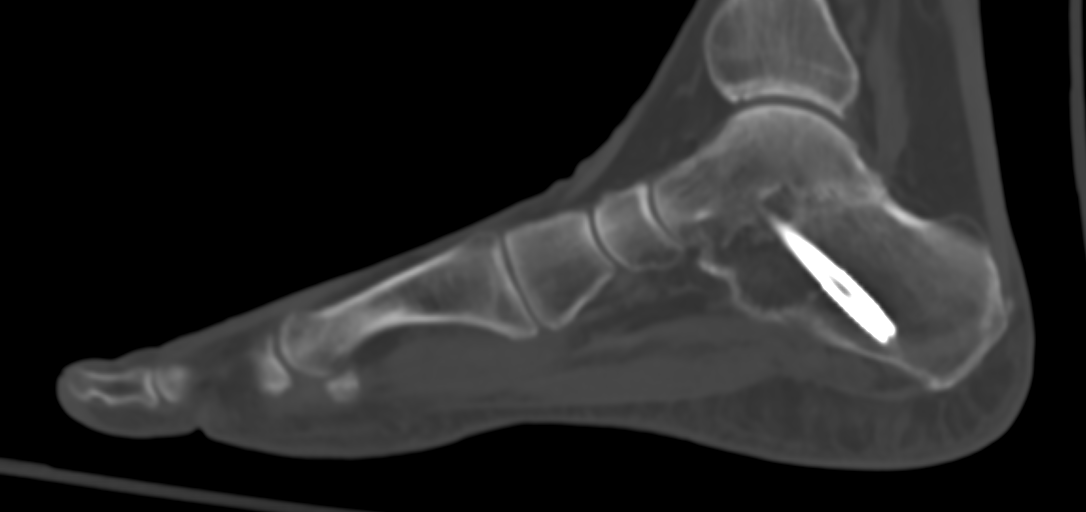
[im 41/70  bone]
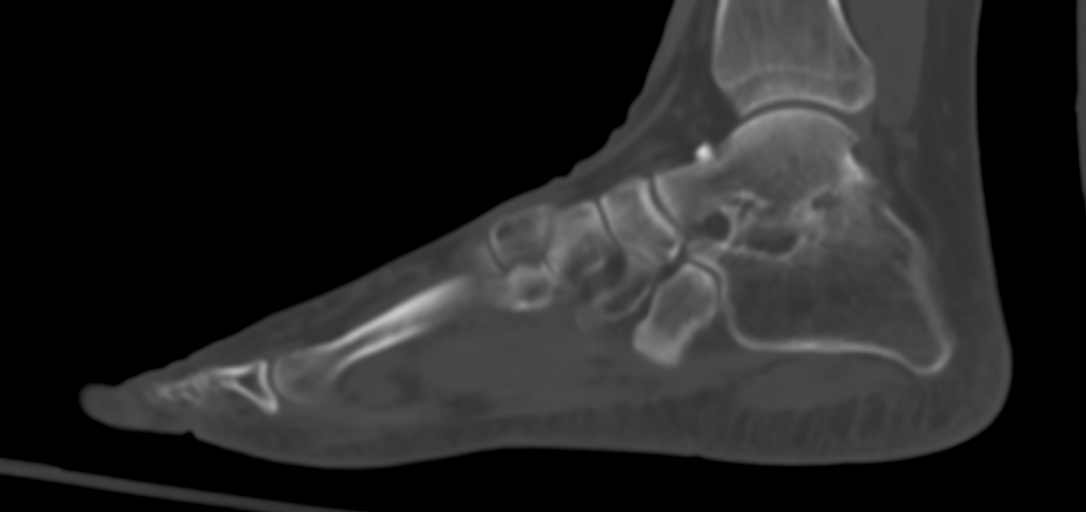
[im 47/70  bone]
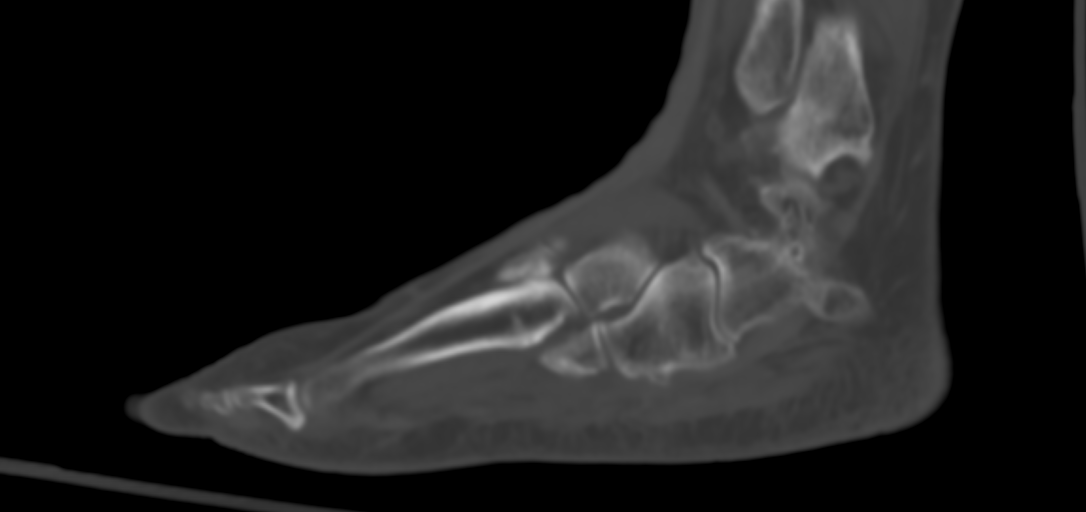

[9 of 33 positions shown; findings below may reference images not displayed]

FINDINGS: Bones/Joint/Cartilage

No acute fracture or dislocation.

Nearly completely united fracture of the proximal shaft of the fifth
metatarsal with an area of persistence of the fracture cleft along
the lateral aspect. Normal alignment. No joint effusion.

Subtalar arthrodesis with solid osseous fusion across the subtalar
joints transfixed with a single cannulated screw.

Ligaments

Ligaments are suboptimally evaluated by CT.

Muscles and Tendons
Muscles are normal. No muscle atrophy. Flexor, extensor, peroneal
and Achilles tendons are grossly intact.

Soft tissue
No fluid collection or hematoma.  No soft tissue mass.
IMPRESSION: 1. Nearly completely united fracture of the proximal shaft of the
fifth metatarsal with an area of persistence of the fracture cleft
along the lateral aspect.

## 2022-09-28 ENCOUNTER — Encounter: Payer: Self-pay | Admitting: Orthopaedic Surgery

## 2022-09-28 ENCOUNTER — Ambulatory Visit (INDEPENDENT_AMBULATORY_CARE_PROVIDER_SITE_OTHER): Payer: Managed Care, Other (non HMO)

## 2022-09-28 ENCOUNTER — Ambulatory Visit (INDEPENDENT_AMBULATORY_CARE_PROVIDER_SITE_OTHER): Payer: Managed Care, Other (non HMO) | Admitting: Orthopaedic Surgery

## 2022-09-28 ENCOUNTER — Telehealth: Payer: Self-pay

## 2022-09-28 DIAGNOSIS — M79672 Pain in left foot: Secondary | ICD-10-CM | POA: Diagnosis not present

## 2022-09-28 MED ORDER — PREDNISONE 10 MG (21) PO TBPK: ORAL_TABLET | ORAL | 3 refills | Status: DC

## 2022-09-28 MED ORDER — TRAMADOL HCL 50 MG PO TABS: 50.0000 mg | ORAL_TABLET | Freq: Every day | ORAL | 0 refills | Status: DC | PRN

## 2022-09-28 NOTE — Progress Notes (Signed)
Office Visit Note   Patient: Brenda Ayala           Date of Birth: 08/11/1955           MRN: [id number] Visit Date: 09/28/2022              Requested by: Wayland Salinas, MD [address] [place],  [place] [address] PCP: Wayland Salinas, MD   Assessment & Plan: Visit Diagnoses:  1. Pain in left foot     Plan: Impression is left plantar fasciitis with acute worsening.  She did not report any history to suggest acute rupture.  Recommend immobilization in cam boot and Superfeet arch supports.  Also recommended arch straps to wear at night.  Will provide gel heel cups.  Home exercises provided.  Will send in prescription for steroid taper.  Use ice as needed.  Limit excessive walking.  Follow-Up Instructions: No follow-ups on file.   Orders:  Orders Placed This Encounter  Procedures   XR Foot Complete Left   Meds ordered this encounter  Medications   predniSONE (STERAPRED UNI-PAK 21 TAB) 10 MG (21) TBPK tablet    Sig: Take as directed    Dispense:  21 tablet    Refill:  3   traMADol (ULTRAM) 50 MG tablet    Sig: Take 1-2 tablets (50-100 mg total) by mouth daily as needed.    Dispense:  20 tablet    Refill:  0      Procedures: No procedures performed   Clinical Data: No additional findings.   Subjective: Chief Complaint  Patient presents with   Left Foot - Pain    HPI  This is a 67 year old female here for acute left heel pain that started a week ago.  She works at [employer] [place] and walks 14,000 steps a day.  The pain has gotten worse.  She has now had to wear tennis shoes.  She has stabbing pain with weightbearing and is worse with the first few steps.  She has had bouts of plantar fasciitis in the past that have gotten better.    Review of Systems   Objective: Vital Signs: There were no vitals taken for this visit.  Physical Exam  Ortho Exam  Examination of left foot shows point tenderness to the plantar aspect of the  calcaneus.  The Achilles is nontender.  She has good excursion of the Achilles.  She has no significant pain in the mid substance of the plantar fascia.  Specialty Comments:  No specialty comments available.  Imaging: No results found.   PMFS History: Patient Active Problem List   Diagnosis Date Noted   Shortness of breath 03/11/2021   Restrictive lung disease 03/11/2021   Post covid-19 condition, unspecified 03/11/2021   Chronic cough 03/11/2021   Nondisplaced fracture of fifth metatarsal bone, left foot, initial encounter for closed fracture 03/10/2021   Pain in left ankle and joints of left foot 03/10/2021   Primary osteoarthritis of left foot 05/29/2019   Stress fracture of metatarsal bone of left foot 05/04/2018   Primary osteoarthritis, right ankle and foot 02/17/2018   Peroneal tendinitis of lower leg, left 02/17/2018   MR (mitral regurgitation) 07/09/2017   Chest pain with moderate risk of acute coronary syndrome 11/01/2016   Family history of coronary artery disease in mother 11/01/2016   Trochanteric bursitis of left hip 01/27/2016   LBBB (left bundle branch block) 10/02/2015   Murmur, cardiac 10/02/2015   Hip bursitis 05/31/2014  Need for prophylactic vaccination and inoculation against influenza 06/11/2013   Benign paroxysmal positional vertigo 06/11/2013   Restless leg syndrome 06/11/2013   Pain in limb 06/11/2013   Hypercholesterolemia 04/22/2013   Essential hypertension, benign 04/22/2013   Dizziness and giddiness 11/18/2012   Urine ketones 11/18/2012   Abdominal pain, unspecified site 11/11/2012   Nausea with vomiting 11/11/2012   Past Medical History:  Diagnosis Date   COVID 2023   had pneumonia   Fracture of left foot    Fracture of left pelvis (HCC)    Heart murmur    Hyperlipidemia    Hypertension    LBBB (left bundle branch block)    Echo in 2009 at [facility] showed mild MR and interventricular septal dyssynergy due to the LBBB.  Her  EF was mildly reduced to 50-55%   Pneumonia     Family History  Problem Relation Age of Onset   Heart disease Mother    Colon cancer Neg Hx    Colon polyps Neg Hx    Esophageal cancer Neg Hx    Rectal cancer Neg Hx    Stomach cancer Neg Hx     Past Surgical History:  Procedure Laterality Date   BRONCHIAL BIOPSY  05/26/2022   Procedure: BRONCHIAL BIOPSIES;  Surgeon: Juanito Doom, MD;  Location: [facility] ENDOSCOPY;  Service: Cardiopulmonary;;   BRONCHIAL WASHINGS  05/26/2022   Procedure: BRONCHIAL WASHINGS;  Surgeon: Juanito Doom, MD;  Location: [facility] ENDOSCOPY;  Service: Cardiopulmonary;;   COLONOSCOPY  07/02/2005   normal   PIP JOINT FUSION Left    steel rod left foot    ROTATOR CUFF REPAIR Bilateral    2 different surgeries  2006 and 2014   VIDEO BRONCHOSCOPY N/A 05/26/2022   Procedure: VIDEO BRONCHOSCOPY WITH FLUORO;  Surgeon: Juanito Doom, MD;  Location: [place];  Service: Cardiopulmonary;  Laterality: N/A;   Social History   Occupational History   Not on file  Tobacco Use   Smoking status: Never   Smokeless tobacco: Never  Vaping Use   Vaping Use: Never used  Substance and Sexual Activity   Alcohol use: Yes    Alcohol/week: 0.0 standard drinks of alcohol    Comment: drinks wine occasionally--3 times a week    Drug use: No   Sexual activity: Yes    Birth control/protection: Post-menopausal

## 2022-09-28 NOTE — Telephone Encounter (Signed)
Tried to call patient. No answer. Dr.Xu is going to work patient in this afternoon on his schedule. Need to know what time she wants to come. I ask her to call me back.

## 2022-10-07 ENCOUNTER — Ambulatory Visit: Payer: Managed Care, Other (non HMO) | Admitting: Cardiovascular Disease

## 2022-10-27 ENCOUNTER — Telehealth: Payer: Self-pay | Admitting: Pulmonary Disease

## 2022-10-27 NOTE — Telephone Encounter (Signed)
I called [name], this PT, asking if she wanted to resched a FU w/Dr. Lake Bells and have a PFT performed per his AVS notes. She was last seen 12/23.  She said she was referred to [facility] by Dr. Lake Bells. She wondered if she still needed to follow up with [place] and was unsure if we were to continue care.   Please advise me  and I am happy to call her to set up this testing and appt for FU.

## 2022-10-28 NOTE — Telephone Encounter (Signed)
Called and left message for patient to call office back.

## 2022-11-01 NOTE — Telephone Encounter (Signed)
Pt returning missed call, advise pt she will get another call back.

## 2022-11-01 NOTE — Telephone Encounter (Signed)
Spoke with patient. I advised patient Dr. Lake Bells is no longer with our practice but I could still get her scheduled with another provider. She stated she would talk to her son who is an anesthesiologist that knows Dr. Lake Bells to see where he is working- she would like to stay with him.  Patient advises she will call our office back if that doesn't work out. Will leave encounter open

## 2022-11-04 ENCOUNTER — Other Ambulatory Visit: Payer: Managed Care, Other (non HMO)

## 2022-12-07 NOTE — Progress Notes (Deleted)
Office Visit Note  Patient: Brenda Ayala             Date of Birth: 1955-10-29           MRN: [id number]             PCP: Deloris Ping, MD Referring: Lupita Leash, MD Visit Date: 12/21/2022 Occupation: @GUAROCC @  Subjective:  No chief complaint on file.   History of Present Illness: Brenda Ayala is a 67 y.o. female ***   Seen in consultation per request of Dr. Kendrick Fries.  Patient developed cough 5 years ago.  She developed COVID-19 infection in June 2022.  Patient's cough became worse after the COVID-19 virus infection with shortness of breath.  Patient was evaluated by Dr. Kendrick Fries in August 2023.  She had high-resolution CT on April 09, 2022 which showed reticulation, mild traction bronchiectasis and no honeycombing.  Moderate air trapping was noted.  There was question about postinflammatory fibrosis due to COVID-19 virus infection or NSIP.  Transbronchial biopsy was nondiagnostic.  Patient was discussed in the ILD conference and use of nintedanib was advised.  Activities of Daily Living:  Patient reports morning stiffness for *** {minute/hour:19697}.   Patient {ACTIONS;DENIES/REPORTS:21021675::"Denies"} nocturnal pain.  Difficulty dressing/grooming: {ACTIONS;DENIES/REPORTS:21021675::"Denies"} Difficulty climbing stairs: {ACTIONS;DENIES/REPORTS:21021675::"Denies"} Difficulty getting out of chair: {ACTIONS;DENIES/REPORTS:21021675::"Denies"} Difficulty using hands for taps, buttons, cutlery, and/or writing: {ACTIONS;DENIES/REPORTS:21021675::"Denies"}  No Rheumatology ROS completed.   PMFS History:  Patient Active Problem List   Diagnosis Date Noted   Shortness of breath 03/11/2021   Restrictive lung disease 03/11/2021   Post covid-19 condition, unspecified 03/11/2021   Chronic cough 03/11/2021   Nondisplaced fracture of fifth metatarsal bone, left foot, initial encounter for closed fracture 03/10/2021   Pain in left ankle and joints of left foot 03/10/2021   Primary  osteoarthritis of left foot 05/29/2019   Stress fracture of metatarsal bone of left foot 05/04/2018   Primary osteoarthritis, right ankle and foot 02/17/2018   Peroneal tendinitis of lower leg, left 02/17/2018   MR (mitral regurgitation) 07/09/2017   Chest pain with moderate risk of acute coronary syndrome 11/01/2016   Family history of coronary artery disease in mother 11/01/2016   Trochanteric bursitis of left hip 01/27/2016   LBBB (left bundle branch block) 10/02/2015   Murmur, cardiac 10/02/2015   Hip bursitis 05/31/2014   Need for prophylactic vaccination and inoculation against influenza 06/11/2013   Benign paroxysmal positional vertigo 06/11/2013   Restless leg syndrome 06/11/2013   Pain in limb 06/11/2013   Hypercholesterolemia 04/22/2013   Essential hypertension, benign 04/22/2013   Dizziness and giddiness 11/18/2012   Urine ketones 11/18/2012   Abdominal pain, unspecified site 11/11/2012   Nausea with vomiting 11/11/2012    Past Medical History:  Diagnosis Date   COVID 2023   had pneumonia   Fracture of left foot    Fracture of left pelvis (HCC)    Heart murmur    Hyperlipidemia    Hypertension    LBBB (left bundle branch block)    Echo in 2009 at [facility] showed mild MR and interventricular septal dyssynergy due to the LBBB.  Her EF was mildly reduced to 50-55%   Pneumonia     Family History  Problem Relation Age of Onset   Heart disease Mother    Colon cancer Neg Hx    Colon polyps Neg Hx    Esophageal cancer Neg Hx    Rectal cancer Neg Hx    Stomach cancer Neg Hx  Past Surgical History:  Procedure Laterality Date   BRONCHIAL BIOPSY  05/26/2022   Procedure: BRONCHIAL BIOPSIES;  Surgeon: Lupita Leash, MD;  Location: [facility] ENDOSCOPY;  Service: Cardiopulmonary;;   BRONCHIAL WASHINGS  05/26/2022   Procedure: BRONCHIAL WASHINGS;  Surgeon: Lupita Leash, MD;  Location: [facility] ENDOSCOPY;  Service: Cardiopulmonary;;   COLONOSCOPY  07/02/2005    normal   PIP JOINT FUSION Left    steel rod left foot    ROTATOR CUFF REPAIR Bilateral    2 different surgeries  2006 and 2014   VIDEO BRONCHOSCOPY N/A 05/26/2022   Procedure: VIDEO BRONCHOSCOPY WITH FLUORO;  Surgeon: Lupita Leash, MD;  Location: [facility] ENDOSCOPY;  Service: Cardiopulmonary;  Laterality: N/A;   Social History   Social History Narrative   Marital Status: Married IT consultant)   Children: Sons (2); Daughter (1)     Pets: None   Living Situation: Lives with husband    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[employer])    Education: Engineer, 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   Physical Exam   Musculoskeletal Exam: ***  CDAI Exam: CDAI Score: -- Patient Global: --; Provider Global: -- Swollen: --; Tender: -- Joint Exam 12/21/2022   No joint exam has been documented for this visit   There is currently no information documented on the homunculus. Go to the Rheumatology activity and complete the homunculus joint exam.  Investigation: No additional findings.  Imaging: No results found.  Recent Labs: Lab Results  Component Value Date   WBC 7.4 05/26/2022   HGB 13.9 05/26/2022   PLT 334 05/26/2022   NA 134 (L) 05/26/2022   K 3.8 05/26/2022   CL 101 05/26/2022   CO2 23 05/26/2022   GLUCOSE 105 (H) 05/26/2022   BUN 16 05/26/2022   CREATININE 0.69 05/26/2022   BILITOT 0.5 02/06/2013   ALKPHOS 65 02/06/2013   AST 23 02/06/2013    ALT 17 02/06/2013   PROT 7.0 02/06/2013   ALBUMIN 4.7 02/06/2013   CALCIUM 9.5 05/26/2022   GFRAA 96 11/05/2016   May 04, 2022 ANA 1: 320NH, centromere negative, Smith negative, SCL 70 negative, dsDNA negative, SSA negative, SSB negative RNP negative, RF negative, anti-CCP negative, Jo 1 negative, CRP 2.1, ESR 36, CK 73, aldolase 4.5, ANCA negative    Chest imaging: November 2022 CT chest showing nonspecific fibrotic changes in the bases of her lungs felt to be probable UIP September 2023 HRCT images  showing traction bronchiectasis, non-specific ground glass opacifications scattered bilaterally in a somewhat random pattern, felt to be indeterminant for UIP by Dr. Fredirick Lathe, in general findings worse in bases, hiatal hernia noted   Other imaging: October 2023 modified barium follow normal   PFT: May 04, 2022 ratio 86%, FVC 1.89 L 58% predicted, total lung capacity 3.61 L 69% predicted, DLCO 13.5 mL/mmHg 66% predicted  Path: October 2023 transbronchial biopsy right lower lobe showed benign  bronchial wall and scant lung parenchyma with mild fibrosis, negative for granulomatous or increased eosinophils, negative for malignancy   Echo: July 29, 2022    Speciality Comments: No specialty comments available.  Procedures:  No procedures performed Allergies: Latex and Sulfa antibiotics   Assessment / Plan:     Visit Diagnoses: Pulmonary fibrosis (HCC) - Patient was initially seen by Dr. Kendrick Fries.  She has an appointment at [facility]  pulmonary.  Restrictive lung disease  Positive ANA (antinuclear antibody)  Primary osteoarthritis, right ankle and foot  Plantar fasciitis of left foot - Evaluated by Dr. Roda Shutters September 28, 2022  Hypercholesterolemia  Essential hypertension, benign  Benign paroxysmal positional vertigo due to bilateral vestibular disorder  Restless leg syndrome  LBBB (left bundle branch block)  Nonrheumatic mitral valve regurgitation  Trochanteric bursitis of  left hip  Post covid-19 condition, unspecified  Orders: No orders of the defined types were placed in this encounter.  No orders of the defined types were placed in this encounter.   Face-to-face time spent with patient was *** minutes. Greater than 50% of time was spent in counseling and coordination of care.  Follow-Up Instructions: No follow-ups on file.   Pollyann Savoy, MD  Note - This record has been created using Animal nutritionist.  Chart creation errors have been sought, but may not always  have been located. Such creation errors do not reflect on  the standard of medical care.

## 2022-12-21 ENCOUNTER — Encounter: Payer: Managed Care, Other (non HMO) | Admitting: Rheumatology

## 2022-12-21 DIAGNOSIS — G2581 Restless legs syndrome: Secondary | ICD-10-CM

## 2022-12-21 DIAGNOSIS — J984 Other disorders of lung: Secondary | ICD-10-CM

## 2022-12-21 DIAGNOSIS — I340 Nonrheumatic mitral (valve) insufficiency: Secondary | ICD-10-CM

## 2022-12-21 DIAGNOSIS — H8113 Benign paroxysmal vertigo, bilateral: Secondary | ICD-10-CM

## 2022-12-21 DIAGNOSIS — J8410 Pulmonary fibrosis, unspecified: Secondary | ICD-10-CM

## 2022-12-21 DIAGNOSIS — M7062 Trochanteric bursitis, left hip: Secondary | ICD-10-CM

## 2022-12-21 DIAGNOSIS — M722 Plantar fascial fibromatosis: Secondary | ICD-10-CM

## 2022-12-21 DIAGNOSIS — M19071 Primary osteoarthritis, right ankle and foot: Secondary | ICD-10-CM

## 2022-12-21 DIAGNOSIS — U099 Post covid-19 condition, unspecified: Secondary | ICD-10-CM

## 2022-12-21 DIAGNOSIS — I10 Essential (primary) hypertension: Secondary | ICD-10-CM

## 2022-12-21 DIAGNOSIS — R768 Other specified abnormal immunological findings in serum: Secondary | ICD-10-CM

## 2022-12-21 DIAGNOSIS — E7800 Pure hypercholesterolemia, unspecified: Secondary | ICD-10-CM

## 2022-12-21 DIAGNOSIS — I447 Left bundle-branch block, unspecified: Secondary | ICD-10-CM

## 2023-01-21 ENCOUNTER — Ambulatory Visit: Payer: Managed Care, Other (non HMO) | Admitting: Cardiovascular Disease

## 2023-03-08 NOTE — Progress Notes (Unsigned)
Cardiology Office Note   Date:  03/09/2023  ID:  Brenda Ayala, DOB [birth year], MRN [id number] PCP:  Deloris Ping, MD [facility] Cardiologist: Thurmon Fair, MD  Reason for visit: 9 month follow-up  History of Present Illness    Brenda Ayala is a 67 y.o. female with a hx of HTN, LBBB, nonobstructive CAD, hypercholesterolemia and mild MR.  Coronary CT angiography performed April 2018 showed a 50% tubular narrowing in the first diagonal artery and mild plaque in the RCA and mid LAD, no significant stenosis.   I last saw her in December 2023.  She had persistent shortness of breath and chronic cough since 2019-at what time blamed on URI, long COVID and pneumonia.  Since then, she has seen by both [facility] and [facility] pulmonology.  Imaging showed diffuse parenchymal lung disease.  Mild restriction and moderate gas exchange impairment by pulm notes April 2024.  Pulmonary plans to follow CT of the chest, PFTs and consider lung biopsy.  She mentions her mother also had interstitial lung disease and she wonders if there is a hereditary component.  Today, patient states she is doing well overall.  She discusses her diagnosis of interstitial lung disease.  She states her cough was better on higher dose of prednisone.  She is now down to prednisone 10mg  daily.  Despite her chronic dyspnea on exertion, she continues to work at [employer], swim laps and is planning to get a stationary bike set up.  She notes weight gain of 17 pounds in the last 9 months after starting prednisone.  She is concerned about becoming a diabetic (glucose normal in 09/2022).  She states the only lasting side effect she has had from prednisone is weight gain.  She denies LE edema.  No chest pain.  Rare palpitations.     Objective / Physical Exam   EKG today: Normal sinus rhythm, LVH, QRS 134  Vital signs:  BP 130/70 (BP Location: Left Arm, Patient Position: Sitting, Cuff Size: Normal)   Pulse 70   Ht 5\' 5"  (1.651  m)   Wt 172 lb 3.2 oz (78.1 kg)   SpO2 91%   BMI 28.66 kg/m     GEN: No acute distress NECK: No carotid bruits CARDIAC: RRR, II/VI systolic murmur RESPIRATORY:  Clear to auscultation without rales, wheezing or rhonchi  EXTREMITIES: No edema  Assessment and Plan   Mitral regurgitation, euvolemic -Echo December 2023 showed EF 65 to 70%, mild mitral regurgitation (stable since 2017) -Can consider rechecking 2D echo in 2026   Coronary artery disease with no angina -Coronary CT angiography April 2018 showed a 50% tubular narrowing in the first diagonal artery and mild plaque in the RCA and mid LAD, no significant stenosis. Her calcium score was 61st percentile for age and gender.  -Continue statin therapy -Continue healthy diet and active lifestyle.   Hypertension, well-controlled -Continue amlodipine 2.5 mg daily and losartan 25 mg twice daily. -Goal BP is <130/80.  Recommend DASH diet (high in vegetables, fruits, low-fat dairy products, whole grains, poultry, fish, and nuts and low in sweets, sugar-sweetened beverages, and red meats), salt restriction and increase physical activity.   Hyperlipidemia   -Switched from Lipitor to Crestor in November by PCP 2/2 myalgias. -LDL 117 in February 2024.  Trig 78, HDL 61.  Continue Crestor 20 mg daily. -Lipids monitored by PCP; likely to fluctuate this year given prednisone dosing. -Discussed cholesterol lowering diets - Mediterranean diet, DASH diet, vegetarian diet, low-carbohydrate diet and avoidance of  trans fats.  Discussed healthier choice substitutes.  Nuts, high-fiber foods, and fiber supplements may also improve lipids.     Palpitations, resolved -Occurred in the setting of COVID.     Disposition - Follow-up in 1 year.   Signed, Cannon Kettle, PA-C  03/09/2023  [facility]

## 2023-03-09 ENCOUNTER — Ambulatory Visit: Payer: Managed Care, Other (non HMO) | Attending: Cardiovascular Disease | Admitting: Physician Assistant

## 2023-03-09 ENCOUNTER — Encounter: Payer: Self-pay | Admitting: Physician Assistant

## 2023-03-09 VITALS — BP 130/70 | HR 70 | Ht 65.0 in | Wt 172.2 lb

## 2023-03-09 DIAGNOSIS — I340 Nonrheumatic mitral (valve) insufficiency: Secondary | ICD-10-CM

## 2023-03-09 DIAGNOSIS — R002 Palpitations: Secondary | ICD-10-CM

## 2023-03-09 DIAGNOSIS — E7800 Pure hypercholesterolemia, unspecified: Secondary | ICD-10-CM | POA: Diagnosis not present

## 2023-03-09 DIAGNOSIS — I2510 Atherosclerotic heart disease of native coronary artery without angina pectoris: Secondary | ICD-10-CM

## 2023-03-09 DIAGNOSIS — I10 Essential (primary) hypertension: Secondary | ICD-10-CM

## 2023-03-09 NOTE — Patient Instructions (Signed)
Medication Instructions:  No Changes *If you need a refill on your cardiac medications before your next appointment, please call your pharmacy*   Lab Work: No labs If you have labs (blood work) drawn today and your tests are completely normal, you will receive your results only by: MyChart Message (if you have MyChart) OR A paper copy in the mail If you have any lab test that is abnormal or we need to change your treatment, we will call you to review the results.   Testing/Procedures: No Testing   Follow-Up: At [facility], you and your health needs are our priority.  As part of our continuing mission to provide you with exceptional heart care, we have created designated Provider Care Teams.  These Care Teams include your primary Cardiologist (physician) and Advanced Practice Providers (APPs -  Physician Assistants and Nurse Practitioners) who all work together to provide you with the care you need, when you need it.  We recommend signing up for the patient portal called "MyChart".  Sign up information is provided on this After Visit Summary.  MyChart is used to connect with patients for Virtual Visits (Telemedicine).  Patients are able to view lab/test results, encounter notes, upcoming appointments, etc.  Non-urgent messages can be sent to your provider as well.   To learn more about what you can do with MyChart, go to [email or web address].    Your next appointment:   1 year(s)  Provider:   Thurmon Fair, MD

## 2023-04-11 ENCOUNTER — Telehealth: Payer: Self-pay | Admitting: Pulmonary Disease

## 2023-04-27 ENCOUNTER — Telehealth: Payer: Self-pay | Admitting: Pulmonary Disease

## 2023-04-29 NOTE — Telephone Encounter (Signed)
Patient states she has gotten this resolved. Nothing further needed.

## 2023-05-19 ENCOUNTER — Telehealth: Payer: Self-pay | Admitting: Pulmonary Disease

## 2023-05-20 NOTE — Telephone Encounter (Signed)
CT orders have been cancelled.

## 2023-08-05 ENCOUNTER — Other Ambulatory Visit: Payer: Self-pay

## 2023-08-05 ENCOUNTER — Emergency Department (HOSPITAL_COMMUNITY): Payer: Medicare Other

## 2023-08-05 ENCOUNTER — Emergency Department (HOSPITAL_COMMUNITY)
Admission: EM | Admit: 2023-08-05 | Discharge: 2023-08-06 | Discharge status: Against Medical Advice | Payer: Medicare Other | Attending: Emergency Medicine | Admitting: Emergency Medicine

## 2023-08-05 ENCOUNTER — Encounter (HOSPITAL_COMMUNITY): Payer: Self-pay | Admitting: Emergency Medicine

## 2023-08-05 DIAGNOSIS — Z5321 Procedure and treatment not carried out due to patient leaving prior to being seen by health care provider: Secondary | ICD-10-CM | POA: Diagnosis not present

## 2023-08-05 DIAGNOSIS — R9431 Abnormal electrocardiogram [ECG] [EKG]: Secondary | ICD-10-CM | POA: Insufficient documentation

## 2023-08-05 DIAGNOSIS — R059 Cough, unspecified: Secondary | ICD-10-CM | POA: Diagnosis not present

## 2023-08-05 DIAGNOSIS — R0789 Other chest pain: Secondary | ICD-10-CM | POA: Insufficient documentation

## 2023-08-05 LAB — CBC
HCT: 37.9 % (ref 36.0–46.0)
Hemoglobin: 12.7 g/dL (ref 12.0–15.0)
MCH: 31.4 pg (ref 26.0–34.0)
MCHC: 33.5 g/dL (ref 30.0–36.0)
MCV: 93.6 fL (ref 80.0–100.0)
Platelets: 361 10*3/uL (ref 150–400)
RBC: 4.05 MIL/uL (ref 3.87–5.11)
RDW: 13.4 % (ref 11.5–15.5)
WBC: 8.3 10*3/uL (ref 4.0–10.5)
nRBC: 0 % (ref 0.0–0.2)

## 2023-08-05 LAB — BASIC METABOLIC PANEL
Anion gap: 12 (ref 5–15)
BUN: 17 mg/dL (ref 8–23)
CO2: 23 mmol/L (ref 22–32)
Calcium: 9.5 mg/dL (ref 8.9–10.3)
Chloride: 100 mmol/L (ref 98–111)
Creatinine, Ser: 0.73 mg/dL (ref 0.44–1.00)
GFR, Estimated: >60 mL/min (ref >60)
Glucose, Bld: 96 mg/dL (ref 70–99)
Potassium: 3.8 mmol/L (ref 3.5–5.1)
Sodium: 135 mmol/L (ref 135–145)

## 2023-08-05 LAB — TROPONIN I (HIGH SENSITIVITY)
Troponin I (High Sensitivity): 12 ng/L (ref <18)
Troponin I (High Sensitivity): 26 ng/L — ABNORMAL HIGH (ref <18)

## 2023-08-05 NOTE — ED Notes (Signed)
 Patient approached this tech in the lobby stating "I'm checking out, I cannot wait any longer to be seen by a provider."

## 2023-08-05 NOTE — ED Triage Notes (Signed)
 Pt sent from [facility] for abnormal EKG per [facility]. Pt seen at [facility] for cough and sharp right sided chest pain.

## 2023-10-26 NOTE — Telephone Encounter (Signed)
 closing

## 2024-04-03 ENCOUNTER — Telehealth: Payer: Self-pay

## 2024-04-03 NOTE — Telephone Encounter (Signed)
   Pre-operative Risk Assessment    Patient Name: Brenda Ayala  DOB: 27-Oct-1955 MRN: [id number]   Date of last office visit: 03/09/23 DELON HOLTS, PA-C Date of next office visit: 05/08/24 ALINE DOOR, PA-C   Request for Surgical Clearance    Procedure:  SP ROBOT THORACOSCOPY, SURGICAL; WITH THERAPEUTIC WEDGE RESECTION (EG, MASS, NODULE), INTITAL UNILATERAL, RIGHT  Date of Surgery:  Clearance 04/16/24                                Surgeon:  NOT INDICATED Surgeon's Group or Practice Name:  [facility] Phone number:  [phone] Fax number:  [phone]   Type of Clearance Requested:   - Medical    Type of Anesthesia:  General    Additional requests/questions:    SignedLucie DELENA Ku   04/03/2024, 7:53 AM

## 2024-04-03 NOTE — Telephone Encounter (Signed)
 Left message for pt to call our office to schedule IN OFFICE Preop appt.

## 2024-04-03 NOTE — Telephone Encounter (Signed)
    Primary Cardiologist:Mihai Croitoru, MD  Chart reviewed as part of pre-operative protocol coverage. Because of Brenda Ayala past medical history and time since last visit, he/she will require a follow-up visit in order to better assess preoperative cardiovascular risk.  Pre-op covering staff: - Please schedule in office appointment and call patient to inform them. - Please contact requesting surgeon's office via preferred method (i.e, phone, fax) to inform them of need for appointment prior to surgery.  If applicable, this message will also be routed to pharmacy pool and/or primary cardiologist for input on holding anticoagulant/antiplatelet agent as requested below so that this information is available at time of patient's appointment.   Brenda CHRISTELLA Beauvais, NP  04/03/2024, 10:44 AM

## 2024-04-03 NOTE — Telephone Encounter (Signed)
 Spoke with pt and scheduled IN OFFICE Preop appt 04/06/24 with Orren Fabry, PA-C  Will update the surgeons office.

## 2024-04-05 NOTE — Progress Notes (Addendum)
 Cardiology Office Note   Date:  04/06/2024  ID:  Brenda Ayala, DOB October 18, 1955, MRN [id number] PCP: Clarance Joen HERO, MD   [facility] Cardiologist:  Jerel Balding, MD   History of Present Illness Brenda Ayala is a 68 y.o. female who with a past medical history of hypertension, LBBB, nonobstructive CAD, hypercholesteremia and mild MR here for follow-up appointment.  She also is in need of preop clearance.  Coronary CT angiogram performed April 2018 showed a 50% tubular narrowing in the first diagonal artery and mild plaque in the RCA and mid LAD, no significant stenosis.  Was seen in December 2023 and had persistent shortness of breath and chronic cough since 2019 (blamed on a URI long COVID as well as pneumonia).  Since then she has been seen by both [facility] and [facility] pulmonary.  Imaging showed diffuse parenchymal lung disease.  Mild restriction and moderate gas exchange impairment by pulmonary notes April 2024.  Pulmonary plan to follow-up CT of the chest, PFTs, and consider biopsy.  She mentioned that her mother had interstitial lung disease and wonders if there is hereditary component.  She was last seen in our office August of last year to discuss her diagnosis of interstitial lung disease.  States her cough was better on the higher dose of prednisone .  Despite her chronic dyspnea on exertion she continues to work at [employer], swims laps and is planning to get a stationary bike.  Noted a weight gain of 17 pounds in the last 9 months after starting prednisone .  Concerned about becoming a diabetic (glucose was normal in February of last year).  She stated only lasting side effects that she had from prednisone 's weight gain.  Denied lower extremity edema, chest pain, and only endorsed rare palpitations.  Today, she presents with pulmonary fibrosis for preoperative clearance for an open lung biopsy and possible wedge resection.  She experiences significant shortness of  breath, worsened over time, and requires continuous oxygen. Her oxygen saturation drops into the low seventies to high sixties during exertion. She is unable to walk one to two blocks or climb stairs without difficulty, even with oxygen.  She has mild, stable mitral valve regurgitation since 2017. She experiences left-sided chest pain that worsens with exertion. No swelling in her feet or ankles, but she wears compression socks to prevent leg cramps. Palpitations have decreased.  Her blood pressure has improved, with the last reading at 140/70 mmHg. She experienced strong headaches at night, which have subsided. She feels lightheaded and dizzy, particularly when lying down, raising concerns about vertigo.  No edema, orthopnea, PND.   Discussed the use of AI scribe software for clinical note transcription with the patient, who gave verbal consent to proceed.   ROS: Pertinent ROS in HPI  Studies Reviewed EKG Interpretation Date/Time:  Friday April 06 2024 08:47:50 EDT Ventricular Rate:  82 PR Interval:  150 QRS Duration:  142 QT Interval:  398 QTC Calculation: 464 R Axis:   -50  Text Interpretation: Normal sinus rhythm Possible Left atrial enlargement Left axis deviation Left bundle branch block When compared with ECG of 05-Aug-2023 20:04, No significant change was found Confirmed by Lucien Blanc [phone]) on 04/06/2024 9:15:49 AM   Echo 07/29/22 IMPRESSIONS     1. Left ventricular ejection fraction, by estimation, is 65 to 70%. Left  ventricular ejection fraction by 2D MOD biplane is 67.0 %. The left  ventricle has normal function. The left ventricle has no regional wall  motion abnormalities.  There is mild left  ventricular hypertrophy. Left ventricular diastolic parameters are  consistent with Grade I diastolic dysfunction (impaired relaxation).   2. Right ventricular systolic function is mildly reduced. The right  ventricular size is normal.   3. The mitral valve is abnormal.  Mild mitral valve regurgitation.   4. The aortic valve is tricuspid. Aortic valve regurgitation is not  visualized.   5. The inferior vena cava is normal in size with greater than 50%  respiratory variability, suggesting right atrial pressure of 3 mmHg.   Comparison(s): Changes from prior study are noted. 10/22/2015: LVEF 60-65%,  mild mitral regurgitation.      Physical Exam VS:  BP (!) 140/70 (BP Location: Left Arm, Patient Position: Sitting, Cuff Size: Normal)   Pulse 82   Ht 5' 4.5 (1.638 m)   Wt 158 lb (71.7 kg)   SpO2 97%   BMI 26.70 kg/m        Wt Readings from Last 3 Encounters:  04/06/24 158 lb (71.7 kg)  08/05/23 169 lb (76.7 kg)  03/09/23 172 lb 3.2 oz (78.1 kg)    GEN: Well nourished, well developed in no acute distress NECK: No JVD; No carotid bruits CARDIAC: RRR, + systolic murmurs, rubs, gallops RESPIRATORY:  Clear to auscultation without rales, wheezing or rhonchi  ABDOMEN: Soft, non-tender, non-distended EXTREMITIES:  No edema; No deformity   ASSESSMENT AND PLAN  Preop clearance  Ms. Pett's perioperative risk of a major cardiac event is 0.4% according to the Revised Cardiac Risk Index (RCRI).  Therefore, she is at low risk for perioperative complications.   Her functional capacity is fair at 4.73 METs according to the [facility] Activity Status Index (DASI). Recommendations: The patient requires an coronary CTA before a disposition can be made regarding surgical risk.   Preoperative cardiovascular evaluation for thoracoscopic lung biopsy and wedge resection Preoperative cardiovascular evaluation for upcoming thoracoscopic lung biopsy and wedge resection scheduled for April 16, 2024. No new findings on EKG; left bundle branch block remains unchanged.  Pulmonary fibrosis with oxygen dependence and progressive exertional dyspnea Pulmonary fibrosis with significant progression, leading to oxygen dependence and increased exertional dyspnea. Previous treatments  with CellCept, mycophenolate, and high-dose prednisone  were ineffective. - Monitor oxygen saturation and ensure proper functioning of portable oxygen device.  Essential hypertension Essential hypertension with recent improvement in blood pressure readings, currently at 140/70 mmHg. Previous episodes of hypertension associated with headaches have resolved.  Nonrheumatic mitral valve regurgitation, stable Nonrheumatic mitral valve regurgitation, mild in severity, unchanged since 2017. - Schedule echocardiogram in October 2025 to reassess mitral valve regurgitation.  Atherosclerotic heart disease of native coronary arteries without angina/chest pain Atherosclerotic heart disease with mild disease in LAD and proximal right coronary artery noted in 2018. No current angina reported. Exertional chest pain present, but likely related to pulmonary issues. - Schedule echocardiogram in October 2025. -Schedule coronary CTA   Hypercholesterolemia Hypercholesterolemia well-managed with current LDL at 71 mg/dL, HDL at 52 mg/dL, and triglycerides at 890 mg/dL.  Insomnia, medication intolerance Insomnia with intolerance to recently prescribed sleeping medication due to severe dry mouth. She finds Tylenol  PM tolerable for sleep. - Advise her to contact prescribing physician regarding intolerable side effects of current sleep medication.  Intermittent lightheadedness and dizziness Intermittent lightheadedness and dizziness, possibly related to blood pressure changes or low oxygen levels. Recent episode of dizziness while lying down, but no consistent pattern observed. - Advise monitoring of blood pressure during episodes of dizziness.  ADDENDUM: Coronary CTA reviewed.  Mild nonobstructive CAD.  Okay to go forward with thoracoscopic lung biopsy and wedge resection without any further cardiac testing.          Dispo: She can follow-up in a month  Signed, Orren LOISE Fabry, PA-C

## 2024-04-06 ENCOUNTER — Ambulatory Visit: Attending: Physician Assistant | Admitting: Physician Assistant

## 2024-04-06 VITALS — BP 140/70 | HR 82 | Ht 64.5 in | Wt 158.0 lb

## 2024-04-06 DIAGNOSIS — Z01810 Encounter for preprocedural cardiovascular examination: Secondary | ICD-10-CM | POA: Insufficient documentation

## 2024-04-06 DIAGNOSIS — R002 Palpitations: Secondary | ICD-10-CM | POA: Insufficient documentation

## 2024-04-06 DIAGNOSIS — I340 Nonrheumatic mitral (valve) insufficiency: Secondary | ICD-10-CM | POA: Diagnosis present

## 2024-04-06 DIAGNOSIS — I2510 Atherosclerotic heart disease of native coronary artery without angina pectoris: Secondary | ICD-10-CM | POA: Insufficient documentation

## 2024-04-06 DIAGNOSIS — I10 Essential (primary) hypertension: Secondary | ICD-10-CM | POA: Insufficient documentation

## 2024-04-06 DIAGNOSIS — E7800 Pure hypercholesterolemia, unspecified: Secondary | ICD-10-CM | POA: Insufficient documentation

## 2024-04-06 MED ORDER — METOPROLOL TARTRATE 100 MG PO TABS: 100.0000 mg | ORAL_TABLET | Freq: Once | ORAL | 0 refills | Status: AC

## 2024-04-06 NOTE — Patient Instructions (Addendum)
 Medication Instructions:  Your physician recommends that you continue on your current medications as directed. Please refer to the Current Medication list given to you today.  *If you need a refill on your cardiac medications before your next appointment, please call your pharmacy*   Testing/Procedures:   Your cardiac CT will be scheduled at one of the below locations:    [facility] [address]  [place], [place] [address] [phone]  If scheduled at the Heart and Vascular Tower at [facility] street, please enter the parking lot using the [address] entrance and use the FREE valet service at the patient drop-off area. Enter the building and check-in with registration on the main floor.  An IV will be required for this test and Nitroglycerin  will be given.  Hold all erectile dysfunction medications at least 3 days (72 hrs) prior to test. (Ie viagra, cialis, sildenafil, tadalafil, etc)   On the Night Before the Test: Be sure to Drink plenty of water. Do not consume any caffeinated/decaffeinated beverages or chocolate 12 hours prior to your test. Do not take any antihistamines 12 hours prior to your test.  On the Day of the Test: Drink plenty of water until 1 hour prior to the test. Do not eat any food 1 hour prior to test. You may take your regular medications prior to the test.  Take metoprolol  (Lopressor ) two hours prior to test. If you take Furosemide/Hydrochlorothiazide/Spironolactone/Chlorthalidone, please HOLD on the morning of the test. Patients who wear a continuous glucose monitor MUST remove the device prior to scanning. FEMALES- please wear underwire-free bra if available, avoid dresses & tight clothing  After the Test: Drink plenty of water. After receiving IV contrast, you may experience a mild flushed feeling. This is normal. On occasion, you may experience a mild rash up to 24 hours after the test. This is not dangerous. If this  occurs, you can take Benadryl 25 mg, Zyrtec, Claritin, or Allegra and increase your fluid intake. (Patients taking Tikosyn should avoid Benadryl, and may take Zyrtec, Claritin, or Allegra) If you experience trouble breathing, this can be serious. If it is severe call 911 IMMEDIATELY. If it is mild, please call our office.  We will call to schedule your test 2-4 weeks out understanding that some insurance companies will need an authorization prior to the service being performed.   For more information and frequently asked questions, please visit our website : [email or web address]  For non-scheduling related questions, please contact the cardiac imaging nurse navigator should you have any questions/concerns: Cardiac Imaging Nurse Navigators Direct Office Dial: [phone]   For scheduling needs, including cancellations and rescheduling, please call [place], [phone].     Your physician has requested that you have an echocardiogram in October. Echocardiography is a painless test that uses sound waves to create images of your heart. It provides your doctor with information about the size and shape of your heart and how well your heart's chambers and valves are working. This procedure takes approximately one hour. There are no restrictions for this procedure. Please do NOT wear cologne, perfume, aftershave, or lotions (deodorant is allowed). Please arrive 15 minutes prior to your appointment time.  Please note: We ask at that you not bring children with you during ultrasound (echo/ vascular) testing. Due to room size and safety concerns, children are not allowed in the ultrasound rooms during exams. Our front office staff cannot provide observation of children in our lobby area while testing is being  conducted. An adult accompanying a patient to their appointment will only be allowed in the ultrasound room at the discretion of the ultrasound technician under special circumstances. We  apologize for any inconvenience.   Follow-Up:  Keep upcoming appointment

## 2024-04-06 NOTE — Addendum Note (Signed)
 Addended by: LUCIEN ORREN SAILOR on: 04/06/2024 05:45 PM   Modules accepted: Orders

## 2024-04-10 ENCOUNTER — Ambulatory Visit (HOSPITAL_COMMUNITY)
Admission: RE | Admit: 2024-04-10 | Discharge: 2024-04-10 | Disposition: A | Discharge status: Home / Self Care | Source: Ambulatory Visit | Attending: Physician Assistant | Admitting: Physician Assistant

## 2024-04-10 DIAGNOSIS — I2510 Atherosclerotic heart disease of native coronary artery without angina pectoris: Secondary | ICD-10-CM | POA: Insufficient documentation

## 2024-04-10 DIAGNOSIS — J984 Other disorders of lung: Secondary | ICD-10-CM | POA: Insufficient documentation

## 2024-04-10 DIAGNOSIS — K449 Diaphragmatic hernia without obstruction or gangrene: Secondary | ICD-10-CM | POA: Diagnosis not present

## 2024-04-10 MED ORDER — NITROGLYCERIN 0.4 MG SL SUBL
0.8000 mg | SUBLINGUAL_TABLET | Freq: Once | SUBLINGUAL | Status: AC
  Administered 2024-04-10: 0.8 mg via SUBLINGUAL

## 2024-04-10 MED ORDER — IOHEXOL 350 MG/ML SOLN
100.0000 mL | Freq: Once | INTRAVENOUS | Status: AC | PRN
  Administered 2024-04-10: 100 mL via INTRAVENOUS

## 2024-04-12 ENCOUNTER — Ambulatory Visit: Payer: Self-pay | Admitting: Physician Assistant

## 2024-04-28 NOTE — Progress Notes (Deleted)
 Cardiology Office Note:    Date:  04/28/2024   ID:  Brenda Ayala, DOB 1956/01/06, MRN [id number]  PCP:  Clarance Joen HERO, MD  Cardiologist:  Jerel Balding, MD { Click to update primary MD,subspecialty MD or APP then REFRESH:1}    Referring MD: Clarance Joen HERO, *   Chief Complaint: follow-up of CAD  History of Present Illness:    Brenda Ayala is a 68 y.o. female with a history of mild non-obstructive CAD on coronary CTA in 04/2024, LBBB, mild MR, interstitial lung disease with chronic hypoxic respiratory failure on chronic 3L of O2, hypertension, and hyperlipidemia who is followed by Dr. Balding and presents today for routine follow-up.   Patient was first seen by Dr. Balding in 2017 for further evaluation of LBBB. Echo in 10/2015 showed normal LVEF of 60-65% with grade 1 diastolic dysfunction and mild MR. Coronary CTA in 10/2016 showed non-obstructive CAD. Monitor was ordered in 01/2022 for further evaluation of palpitations following COVID and showed 2 brief episodes of non-sustained atrial tachycardia and rare PVCs but no atrial fibrillation or ventricular arrhythmias.   She has a history of chronic dyspnea  and was ultimately diagnosed with interstitial lung disease.  Prior bronchoscopy with biopsy in 2023 did not show evidence of eosinophilic or other inflammatory pneumonia. She was seen by Rheumatology and found no evidence of connective tissue disease.   She was last seen by Orren Fabry, PA-C, on 04/06/2024 for preop evaluation for upcoming open lung biopsy and possible wedge resection. At that visit, she reported significant shortness of breath as well as some left sided chest pain . Coronary CTA was ordered of for further evaluation showed a coronary calcium  score of 262 (86th percentile for age and sex) and mild non-obstructive CAD with mild (25-49% stenosis) noted in the LAD and minimal disease (1-24% stenosis) noted in the RCA and LCx. Echo showed ***  She underwent open lung  biopsy at [facility] on 04/16/2024. She was seen by her Pulmonologist on 04/25/2024 for an acute visit for ongoing pleuritic pain and shortness of breath. They were still waiting for the results of the biopsy at that time to further guide treatment. However, she was referred to IR for right sided thoracentesis and prescribed Prednisone  and Doxycycline.   Patient presents today for follow-up. ***  Non-Obstructive CAD Recent coronary CTA on 04/10/2024 showed a coronary calcium  score of 262 (86th percentile for age and sex) and mild non-obstructive CAD with mild (25-49% stenosis) noted in the LAD and minimal disease (1-24% stenosis) noted in the RCA and LCx. - No cardiac chest pain.  - Aspirin *** - Continue Crestor 20mg  daily or Lipitor 40mg  daily. ***   Mild Mitral Regurgitation Recent Echo on 05/07/2024 showed ***  Hypertension BP *** - Continue current medications: Amlodipine  2.5mg  daily and Losartan 100mg  daily.  Hyperlipidemia Lipid panel ***: - Continue Crestor 20mg  daily or Lipitor 40mg  daily. ***   EKGs/Labs/Other Studies Reviewed:    The following studies were reviewed:  Monitor 02/21/2022 to 03/06/2022:   Dominant rhythm is normal sinus with normal circadian rhythm variation.   There are rare premature atrial complexes and rare premature ventricular complexes.   There were 2 brief episodes of nonsustained atrial tachycardia.  There is no evidence of atrial fibrillation.   There is no complex ventricular arrhythmia.   Many symptom-triggered recordings showed normal rhythm, although some are associated with premature atrial complexes and with one of the brief episodes of atrial tachycardia.   Mild abnormalities  on event monitor with rare PVCs, 2 very brief episodes of nonsustained atrial tachycardia and rare PVCs. There is no evidence of atrial fibrillation or ventricular tachycardia. There is inconsistent correlation between rhythm and symptoms. _______________  Coronary CTA  04/10/2024: Impression: 1. Coronary calcium  score of 262. This was 86th percentile for age, sex, and race matched control. 2. Normal coronary origin with Right dominance. 3. CAD-RADS 2. Mild non-obstructive CAD (25-49%). Consider non-atherosclerotic causes of chest pain. Consider preventive therapy and risk factor modification. _______________  Echocardiogram 05/07/2024: ***  EKG:  EKG not ordered today.   Recent Labs: 08/05/2023: BUN 17; Creatinine, Ser 0.73; Hemoglobin 12.7; Platelets 361; Potassium 3.8; Sodium 135  Recent Lipid Panel    Component Value Date/Time   CHOL 258 (H) 08/12/2020 1003   TRIG 143 08/12/2020 1003   HDL 75 08/12/2020 1003   CHOLHDL 3.4 08/12/2020 1003   CHOLHDL 4.7 02/06/2013 0756   VLDL 20 02/06/2013 0756   LDLCALC 158 (H) 08/12/2020 1003    Physical Exam:    Vital Signs: There were no vitals taken for this visit.    Wt Readings from Last 3 Encounters:  04/06/24 158 lb (71.7 kg)  08/05/23 169 lb (76.7 kg)  03/09/23 172 lb 3.2 oz (78.1 kg)     General: 68 y.o. female in no acute distress. HEENT: Normocephalic and atraumatic. Sclera clear.  Neck: Supple. No carotid bruits. No JVD. Heart: *** RRR. Distinct S1 and S2. No murmurs, gallops, or rubs.  Lungs: No increased work of breathing. Clear to ausculation bilaterally. No wheezes, rhonchi, or rales.  Abdomen: Soft, non-distended, and non-tender to palpation.  Extremities: No lower extremity edema.  Radial and distal pedal pulses 2+ and equal bilaterally. Skin: Warm and dry. Neuro: No focal deficits. Psych: Normal affect. Responds appropriately.   Assessment:    No diagnosis found.  Plan:     Disposition: Follow up in ***   Signed, Aline FORBES Door, PA-C  04/28/2024 9:47 AM    [facility]

## 2024-05-07 ENCOUNTER — Ambulatory Visit (HOSPITAL_COMMUNITY)

## 2024-05-08 ENCOUNTER — Ambulatory Visit: Admitting: Student

## 2024-06-02 DEATH — deceased
